# Patient Record
Sex: Female | Born: 1955 | Race: White | Hispanic: No | Smoking: Former smoker
Health system: Southern US, Community
[De-identification: ages and names within clinical notes are randomized; demographics above are authoritative.]

## PROBLEM LIST (undated history)

## (undated) DIAGNOSIS — M4800 Spinal stenosis, site unspecified: Secondary | ICD-10-CM

## (undated) DIAGNOSIS — F329 Major depressive disorder, single episode, unspecified: Secondary | ICD-10-CM

## (undated) DIAGNOSIS — G4730 Sleep apnea, unspecified: Secondary | ICD-10-CM

## (undated) DIAGNOSIS — I10 Essential (primary) hypertension: Secondary | ICD-10-CM

## (undated) DIAGNOSIS — C801 Malignant (primary) neoplasm, unspecified: Secondary | ICD-10-CM

## (undated) DIAGNOSIS — G43909 Migraine, unspecified, not intractable, without status migrainosus: Secondary | ICD-10-CM

## (undated) DIAGNOSIS — J45909 Unspecified asthma, uncomplicated: Secondary | ICD-10-CM

## (undated) DIAGNOSIS — F32A Depression, unspecified: Secondary | ICD-10-CM

## (undated) DIAGNOSIS — K219 Gastro-esophageal reflux disease without esophagitis: Secondary | ICD-10-CM

## (undated) DIAGNOSIS — K760 Fatty (change of) liver, not elsewhere classified: Secondary | ICD-10-CM

## (undated) DIAGNOSIS — I2510 Atherosclerotic heart disease of native coronary artery without angina pectoris: Secondary | ICD-10-CM

## (undated) HISTORY — PX: ECTOPIC PREGNANCY SURGERY: SHX613

## (undated) HISTORY — PX: FRACTURE SURGERY: SHX138

## (undated) HISTORY — PX: TUBAL LIGATION: SHX77

## (undated) HISTORY — PX: DILATION AND CURETTAGE OF UTERUS: SHX78

---

## 2011-09-15 DIAGNOSIS — F32A Depression, unspecified: Secondary | ICD-10-CM | POA: Insufficient documentation

## 2015-06-06 ENCOUNTER — Encounter: Payer: Self-pay | Admitting: Emergency Medicine

## 2015-06-06 ENCOUNTER — Ambulatory Visit
Admission: EM | Admit: 2015-06-06 | Discharge: 2015-06-06 | Disposition: A | Discharge status: Home / Self Care | Payer: BC Managed Care – PPO | Attending: Family Medicine | Admitting: Family Medicine

## 2015-06-06 DIAGNOSIS — G43009 Migraine without aura, not intractable, without status migrainosus: Secondary | ICD-10-CM | POA: Diagnosis not present

## 2015-06-06 HISTORY — DX: Major depressive disorder, single episode, unspecified: F32.9

## 2015-06-06 HISTORY — DX: Depression, unspecified: F32.A

## 2015-06-06 HISTORY — DX: Essential (primary) hypertension: I10

## 2015-06-06 HISTORY — DX: Migraine, unspecified, not intractable, without status migrainosus: G43.909

## 2015-06-06 MED ORDER — KETOROLAC TROMETHAMINE 60 MG/2ML IM SOLN
60.0000 mg | Freq: Once | INTRAMUSCULAR | Status: AC
  Administered 2015-06-06: 60 mg via INTRAMUSCULAR

## 2015-06-06 MED ORDER — MUPIROCIN 2 % EX OINT: 1.0000 "application " | TOPICAL_OINTMENT | Freq: Three times a day (TID) | CUTANEOUS | Status: DC

## 2015-06-06 MED ORDER — KETOROLAC TROMETHAMINE 10 MG PO TABS: 10.0000 mg | ORAL_TABLET | Freq: Three times a day (TID) | ORAL | Status: DC | PRN

## 2015-06-06 MED ORDER — ONDANSETRON 4 MG PO TBDP
4.0000 mg | ORAL_TABLET | Freq: Once | ORAL | Status: AC
  Administered 2015-06-06: 4 mg via ORAL

## 2015-06-06 MED ORDER — FLUTICASONE PROPIONATE 50 MCG/ACT NA SUSP: 2.0000 | Freq: Every day | NASAL | Status: DC

## 2015-06-06 NOTE — ED Notes (Signed)
Patient c/o sinus pressure and congestion and HA since yesterday.

## 2015-06-06 NOTE — ED Provider Notes (Signed)
CSN: [id number]     Arrival date & time 06/06/15  1614 History   First MD Initiated Contact with Patient 06/06/15 1719     Chief Complaint  Patient presents with  . Facial Pain  . Headache   (Consider location/radiation/quality/duration/timing/severity/associated sxs/prior Treatment) HPI   This 59 year old female who presents with a headache she feels mostly behind her right eye and spreading into the back of her head and sinus infection symptoms with pressure and age of yellow-green C mucus last 2-3 days. She is felt feverish but has not taken her temperature and is afebrile today. Her blood pressure is elevated she has hypertension and is on medications and may possibly be from her pain. She is under a lot of pressure at the present time since her family is coming in to visit for the holidays. She gets this on a yearly basis with the anticipation. She usually takes Fioricet for her headache but this does not seem to be working this time.  Past Medical History  Diagnosis Date  . Migraine   . Hypertension   . Depression    Past Surgical History  Procedure Laterality Date  . Cesarean section    . Tubal ligation     History reviewed. No pertinent family history. Social History  Substance Use Topics  . Smoking status: Former Research scientist (life sciences)  . Smokeless tobacco: None  . Alcohol Use: Yes   OB History    No data available     Review of Systems  Constitutional: Positive for fever and activity change. Negative for chills, diaphoresis, appetite change and fatigue.  HENT: Positive for congestion, postnasal drip, rhinorrhea and sinus pressure.   Eyes: Positive for pain.  Gastrointestinal: Positive for nausea.  Neurological: Positive for headaches.    Allergies  Penicillins  Home Medications   Prior to Admission medications   Medication Sig Start Date End Date Taking? Authorizing Provider  benazepril (LOTENSIN) 10 MG tablet Take 10 mg by mouth daily.   Yes Historical Provider, MD   butalbital-acetaminophen-caffeine (FIORICET, ESGIC) 50-325-40 MG tablet Take 1 tablet by mouth 2 (two) times daily as needed for headache.   Yes Historical Provider, MD  sertraline (ZOLOFT) 100 MG tablet Take 100 mg by mouth daily.   Yes Historical Provider, MD  fluticasone (FLONASE) 50 MCG/ACT nasal spray Place 2 sprays into both nostrils daily. 06/06/15   Lorin Picket, PA-C  ketorolac (TORADOL) 10 MG tablet Take 1 tablet (10 mg total) by mouth every 8 (eight) hours as needed. 06/06/15   Lorin Picket, PA-C  mupirocin ointment (BACTROBAN) 2 % Apply 1 application topically 3 (three) times daily. 06/06/15   Lorin Picket, PA-C   Meds Ordered and Administered this Visit   Medications  ketorolac (TORADOL) injection 60 mg (60 mg Intramuscular Given 06/06/15 1810)  ondansetron (ZOFRAN-ODT) disintegrating tablet 4 mg (4 mg Oral Given 06/06/15 1810)    BP 153/95 mmHg  Pulse 68  Temp(Src) 97.9 F (36.6 C) (Tympanic)  Resp 18  Ht 5\' 7"  (1.702 m)  Wt 200 lb (90.719 kg)  BMI 31.32 kg/m2  SpO2 99% No data found.   Physical Exam  Constitutional: She is oriented to person, place, and time. She appears well-developed and well-nourished. No distress.  HENT:  Head: Normocephalic and atraumatic.  Right Ear: External ear normal.  Left Ear: External ear normal.  Nose: Nose normal.  Mouth/Throat: Oropharynx is clear and moist. No oropharyngeal exudate.  Eyes: Conjunctivae are normal. Pupils are equal, round, and reactive  to light.  Neck: Normal range of motion. Neck supple.  Pulmonary/Chest: Effort normal and breath sounds normal. No stridor. No respiratory distress. She has no wheezes. She has no rales.  Musculoskeletal: Normal range of motion. She exhibits no edema or tenderness.  Lymphadenopathy:    She has no cervical adenopathy.  Neurological: She is alert and oriented to person, place, and time.  Skin: Skin is warm and dry. She is not diaphoretic.  Psychiatric: She has a normal  mood and affect. Her behavior is normal. Judgment and thought content normal.  Nursing note and vitals reviewed.   ED Course  Procedures (including critical care time)  Labs Review Labs Reviewed - No data to display  Imaging Review No results found.   Visual Acuity Review  Right Eye Distance:   Left Eye Distance:   Bilateral Distance:    Right Eye Near:   Left Eye Near:    Bilateral Near:     18:10 Medication Given LW  ketorolac (TORADOL) injection 60 mg - Dose: 60 mg ; Route: Intramuscular ; Site: Right Upper Outer Quadrant ; Scheduled Time: [id number]     18:10 Medication Given LW  ondansetron (ZOFRAN-ODT) disintegrating tablet 4 mg - Dose: 4 mg ; Route: Oral ; Scheduled Time: [id number]         MDM   1. Migraine without aura and without status migrainosus, not intractable    New Prescriptions   FLUTICASONE (FLONASE) 50 MCG/ACT NASAL SPRAY    Place 2 sprays into both nostrils daily.   KETOROLAC (TORADOL) 10 MG TABLET    Take 1 tablet (10 mg total) by mouth every 8 (eight) hours as needed.   MUPIROCIN OINTMENT (BACTROBAN) 2 %    Apply 1 application topically 3 (three) times daily.  Plan: 1. Diagnosis reviewed with patient 2. rx as per orders; risks, benefits, potential side effects reviewed with patient 3. Recommend supportive treatment with Flonase and rest and fluids. Follow-up with her primary care. Given her a few Toradol pills to use at home as necessary. I've also provided her with some Bactroban that she will use intranasally for a small sore that she has on the inner side of her nostril on the left 4. F/u prn if symptoms worsen or don't improve     Lorin Picket, PA-C 06/06/15 1859

## 2015-06-06 NOTE — Discharge Instructions (Signed)

## 2015-10-15 ENCOUNTER — Ambulatory Visit
Admission: EM | Admit: 2015-10-15 | Discharge: 2015-10-15 | Disposition: A | Discharge status: Home / Self Care | Payer: BC Managed Care – PPO | Attending: Family Medicine | Admitting: Family Medicine

## 2015-10-15 ENCOUNTER — Encounter: Payer: Self-pay | Admitting: *Deleted

## 2015-10-15 DIAGNOSIS — H6981 Other specified disorders of Eustachian tube, right ear: Secondary | ICD-10-CM

## 2015-10-15 DIAGNOSIS — J019 Acute sinusitis, unspecified: Secondary | ICD-10-CM

## 2015-10-15 DIAGNOSIS — J029 Acute pharyngitis, unspecified: Secondary | ICD-10-CM

## 2015-10-15 LAB — RAPID STREP SCREEN (MED CTR MEBANE ONLY): Streptococcus, Group A Screen (Direct): NEGATIVE

## 2015-10-15 MED ORDER — FLUTICASONE PROPIONATE 50 MCG/ACT NA SUSP: 2.0000 | Freq: Every day | NASAL | Status: DC

## 2015-10-15 MED ORDER — FEXOFENADINE-PSEUDOEPHED ER 180-240 MG PO TB24: 1.0000 | ORAL_TABLET | Freq: Every day | ORAL | Status: DC

## 2015-10-15 MED ORDER — FEXOFENADINE-PSEUDOEPHED ER 180-240 MG PO TB24: 1.0000 | ORAL_TABLET | Freq: Every day | ORAL | Status: AC

## 2015-10-15 MED ORDER — AMOXICILLIN-POT CLAVULANATE 875-125 MG PO TABS: 1.0000 | ORAL_TABLET | Freq: Two times a day (BID) | ORAL | Status: DC

## 2015-10-15 NOTE — ED Notes (Signed)
Patient started having symptoms of sore throat and nasal congestion 1 week ago. Additional symptoms of bilateral ear pain occurred more recently.

## 2015-10-15 NOTE — ED Provider Notes (Signed)
CSN: [id number]     Arrival date & time 10/15/15  [id number] History   First MD Initiated Contact with Patient 10/15/15 1028    Nurses notes were reviewed.  Chief Complaint  Patient presents with  . Sore Throat  . Nasal Congestion   Patient since 60 year old white female who states that she's had sore throat for over a week. She reports while having a sore throat she's had nasal congestion and cough. Last night her right ear started bothering her and was quite painful. She reports no productive cough nasal congestions bad. She is a former smoker.  Medical problems she is being treated for hypertension and depression. She's had multiple skin surgeries for skin cancer she's under current treatment now. She's having suspicious treatment since she's had Mohs surgery for him and want to repeat most surgically to her. He states that she was unable to work yesterday and she also was able today because of fatigue and tiredness.  (Consider location/radiation/quality/duration/timing/severity/associated sxs/prior Treatment) Patient is a 60 y.o. female presenting with pharyngitis. The history is provided by the patient. No language interpreter was used.  Sore Throat This is a new problem. The current episode started more than 1 week ago. The problem has been gradually worsening. Pertinent negatives include no chest pain, no abdominal pain, no headaches and no shortness of breath. Nothing aggravates the symptoms. Nothing relieves the symptoms. She has tried nothing for the symptoms. The treatment provided no relief.    Past Medical History  Diagnosis Date  . Migraine   . Hypertension   . Depression    Past Surgical History  Procedure Laterality Date  . Cesarean section    . Tubal ligation     History reviewed. No pertinent family history. Social History  Substance Use Topics  . Smoking status: Former Research scientist (life sciences)  . Smokeless tobacco: Never Used  . Alcohol Use: Yes   OB History    No data available      Review of Systems  HENT: Positive for ear pain, rhinorrhea and sinus pressure.   Respiratory: Negative for shortness of breath.   Cardiovascular: Negative for chest pain.  Gastrointestinal: Negative for abdominal pain.  Neurological: Negative for headaches.  All other systems reviewed and are negative.   Allergies  Penicillins  Home Medications   Prior to Admission medications   Medication Sig Start Date End Date Taking? Authorizing Provider  benazepril (LOTENSIN) 10 MG tablet Take 10 mg by mouth daily.   Yes Historical Provider, MD  fluticasone (FLONASE) 50 MCG/ACT nasal spray Place 2 sprays into both nostrils daily. 06/06/15  Yes Lorin Picket, PA-C  sertraline (ZOLOFT) 100 MG tablet Take 100 mg by mouth daily.   Yes Historical Provider, MD  amoxicillin-clavulanate (AUGMENTIN) 875-125 MG tablet Take 1 tablet by mouth 2 (two) times daily. 10/15/15   Frederich Cha, MD  butalbital-acetaminophen-caffeine (FIORICET, ESGIC) [phone] MG tablet Take 1 tablet by mouth 2 (two) times daily as needed for headache.    Historical Provider, MD  fexofenadine-pseudoephedrine (ALLEGRA-D ALLERGY & CONGESTION) 180-240 MG 24 hr tablet Take 1 tablet by mouth daily. 10/15/15   Frederich Cha, MD  fluticasone (FLONASE) 50 MCG/ACT nasal spray Place 2 sprays into both nostrils daily. 10/15/15   Frederich Cha, MD  ketorolac (TORADOL) 10 MG tablet Take 1 tablet (10 mg total) by mouth every 8 (eight) hours as needed. 06/06/15   Lorin Picket, PA-C  mupirocin ointment (BACTROBAN) 2 % Apply 1 application topically 3 (three) times daily. 06/06/15  Lorin Picket, PA-C   Meds Ordered and Administered this Visit  Medications - No data to display  BP 125/73 mmHg  Pulse 75  Temp(Src) 98.1 F (36.7 C) (Oral)  Resp 18  Ht 5\' 7"  (1.702 m)  Wt 200 lb (90.719 kg)  BMI 31.32 kg/m2  SpO2 97% No data found.   Physical Exam  Constitutional: She is oriented to person, place, and time. She appears well-developed  and well-nourished.  HENT:  Head: Normocephalic and atraumatic.  Right Ear: External ear and ear canal normal. Tympanic membrane is bulging. A middle ear effusion is present.  Left Ear: Hearing, tympanic membrane, external ear and ear canal normal.  Nose: Rhinorrhea present. Right sinus exhibits maxillary sinus tenderness. Left sinus exhibits maxillary sinus tenderness.  Mouth/Throat: Uvula is midline. No uvula swelling. Posterior oropharyngeal erythema present.  Eyes: Conjunctivae are normal. Pupils are equal, round, and reactive to light.  Neck: Normal range of motion.  Cardiovascular: Normal rate and regular rhythm.   Pulmonary/Chest: Effort normal and breath sounds normal.  Musculoskeletal: Normal range of motion. She exhibits no tenderness.  Neurological: She is alert and oriented to person, place, and time.  Skin: Skin is warm and dry.  Psychiatric: She has a normal mood and affect.  Vitals reviewed.   ED Course  Procedures (including critical care time)  Labs Review Labs Reviewed  RAPID STREP SCREEN (NOT AT [facility])  CULTURE, GROUP A STREP [facility])    Imaging Review No results found.   Visual Acuity Review  Right Eye Distance:   Left Eye Distance:   Bilateral Distance:    Right Eye Near:   Left Eye Near:    Bilateral Near:      Results for orders placed or performed during the hospital encounter of 10/15/15  Rapid strep screen  Result Value Ref Range   Streptococcus, Group A Screen (Direct) NEGATIVE NEGATIVE     MDM   1. Eustachian tube dysfunction, right   2. Acute sinusitis, recurrence not specified, unspecified location   3. Pharyngitis    Will have patient return to PCP if not better in 1-2 weeks. Patient is assured me that she is taken Augmentin since she was told she had penicillin allergies. Augmentin has been used before  as well as amoxicillin. Will give a prescription for Augmentin 875 one tablet twice a day Allegra-D and Flonase nasal spray as well.  Follow-up as needed. Work note given for today and tomorrow.   Note: This dictation was prepared with Dragon dictation along with smaller phrase technology. Any transcriptional errors that result from this process are unintentional.    Frederich Cha, MD 10/15/15 1131

## 2015-10-15 NOTE — Discharge Instructions (Signed)
Pharyngitis Pharyngitis is a sore throat (pharynx). There is redness, pain, and swelling of your throat. HOME CARE   Drink enough fluids to keep your pee (urine) clear or pale yellow.  Only take medicine as told by your doctor.  You may get sick again if you do not take medicine as told. Finish your medicines, even if you start to feel better.  Do not take aspirin.  Rest.  Rinse your mouth (gargle) with salt water ( tsp of salt per 1 qt of water) every 1-2 hours. This will help the pain.  If you are not at risk for choking, you can suck on hard candy or sore throat lozenges. GET HELP IF:  You have large, tender lumps on your neck.  You have a rash.  You cough up green, yellow-brown, or bloody spit. GET HELP RIGHT AWAY IF:   You have a stiff neck.  You drool or cannot swallow liquids.  You throw up (vomit) or are not able to keep medicine or liquids down.  You have very bad pain that does not go away with medicine.  You have problems breathing (not from a stuffy nose). MAKE SURE YOU:   Understand these instructions.  Will watch your condition.  Will get help right away if you are not doing well or get worse.   This information is not intended to replace advice given to you by your health care provider. Make sure you discuss any questions you have with your health care provider.   Document Released: 11/25/2007 Document Revised: 03/29/2013 Document Reviewed: 02/13/2013 Elsevier Interactive Patient Education 2016 Else[employer]  Sinusitis, Adult Sinusitis is redness, soreness, and puffiness (inflammation) of the air pockets in the bones of your face (sinuses). The redness, soreness, and puffiness can cause air and mucus to get trapped in your sinuses. This can allow germs to grow and cause an infection.  HOME CARE   Drink enough fluids to keep your pee (urine) clear or pale yellow.  Use a humidifier in your home.  Run a hot shower to create steam in the  bathroom. Sit in the bathroom with the door closed. Breathe in the steam 3-4 times a day.  Put a warm, moist washcloth on your face 3-4 times a day, or as told by your doctor.  Use salt water sprays (saline sprays) to wet the thick fluid in your nose. This can help the sinuses drain.  Only take medicine as told by your doctor. GET HELP RIGHT AWAY IF:   Your pain gets worse.  You have very bad headaches.  You are sick to your stomach (nauseous).  You throw up (vomit).  You are very sleepy (drowsy) all the time.  Your face is puffy (swollen).  Your vision changes.  You have a stiff neck.  You have trouble breathing. MAKE SURE YOU:   Understand these instructions.  Will watch your condition.  Will get help right away if you are not doing well or get worse.   This information is not intended to replace advice given to you by your health care provider. Make sure you discuss any questions you have with your health care provider.   Document Released: 11/25/2007 Document Revised: 06/29/2014 Document Reviewed: 01/12/2012 Elsevier Interactive Patient Education 2016 [place] media is inflammation of your middle ear. This occurs when the auditory tube (eustachian tube) leading from the back of your nose (nasopharynx) to your eardrum is blocked. This blockage may result from a cold, environmental  allergies, or an upper respiratory infection. Unresolved barotitis media may lead to damage or hearing loss (barotrauma), which may become permanent. HOME CARE INSTRUCTIONS   Use medicines as recommended by your health care provider. Over-the-counter medicines will help unblock the canal and can help during times of air travel.  Do not put anything into your ears to clean or unplug them. Eardrops will not be helpful.  Do not swim, dive, or fly until your health care provider says it is all right to do so. If these activities are necessary, chewing gum with  frequent, forceful swallowing may help. It is also helpful to hold your nose and gently blow to pop your ears for equalizing pressure changes. This forces air into the eustachian tube.  Only take over-the-counter or prescription medicines for pain, discomfort, or fever as directed by your health care provider.  A decongestant may be helpful in decongesting the middle ear and make pressure equalization easier. SEEK MEDICAL CARE IF:  You experience a serious form of dizziness in which you feel as if the room is spinning and you feel nauseated (vertigo).  Your symptoms only involve one ear. SEEK IMMEDIATE MEDICAL CARE IF:   You develop a severe headache, dizziness, or severe ear pain.  You have bloody or pus-like drainage from your ears.  You develop a fever.  Your problems do not improve or become worse. MAKE SURE YOU:   Understand these instructions.  Will watch your condition.  Will get help right away if you are not doing well or get worse.   This information is not intended to replace advice given to you by your health care provider. Make sure you discuss any questions you have with your health care provider.   Document Released: 06/05/2000 Document Revised: 03/29/2013 Document Reviewed: 01/03/2013 Elsevier Interactive Patient Education [employer].

## 2015-10-17 LAB — CULTURE, GROUP A STREP (THRC)

## 2016-04-17 DIAGNOSIS — G4733 Obstructive sleep apnea (adult) (pediatric): Secondary | ICD-10-CM | POA: Insufficient documentation

## 2016-12-22 ENCOUNTER — Ambulatory Visit (INDEPENDENT_AMBULATORY_CARE_PROVIDER_SITE_OTHER): Payer: BC Managed Care – PPO | Admitting: Vascular Surgery

## 2016-12-22 ENCOUNTER — Other Ambulatory Visit (INDEPENDENT_AMBULATORY_CARE_PROVIDER_SITE_OTHER): Payer: Self-pay

## 2016-12-22 ENCOUNTER — Encounter (INDEPENDENT_AMBULATORY_CARE_PROVIDER_SITE_OTHER): Payer: Self-pay

## 2016-12-22 ENCOUNTER — Encounter (INDEPENDENT_AMBULATORY_CARE_PROVIDER_SITE_OTHER): Payer: Self-pay | Admitting: Vascular Surgery

## 2016-12-22 VITALS — BP 123/80 | HR 70 | Resp 17 | Ht 67.0 in | Wt 220.0 lb

## 2016-12-22 DIAGNOSIS — M79604 Pain in right leg: Secondary | ICD-10-CM

## 2016-12-22 DIAGNOSIS — I10 Essential (primary) hypertension: Secondary | ICD-10-CM | POA: Diagnosis not present

## 2016-12-22 DIAGNOSIS — I700 Atherosclerosis of aorta: Secondary | ICD-10-CM

## 2016-12-22 DIAGNOSIS — M79605 Pain in left leg: Secondary | ICD-10-CM | POA: Diagnosis not present

## 2016-12-22 DIAGNOSIS — M79609 Pain in unspecified limb: Secondary | ICD-10-CM | POA: Insufficient documentation

## 2016-12-22 NOTE — Patient Instructions (Signed)
Peripheral Vascular Disease Peripheral vascular disease (PVD) is a disease of the blood vessels that are not part of your heart and brain. A simple term for PVD is poor circulation. In most cases, PVD narrows the blood vessels that carry blood from your heart to the rest of your body. This can result in a decreased supply of blood to your arms, legs, and internal organs, like your stomach or kidneys. However, it most often affects a person's lower legs and feet. There are two types of PVD.  Organic PVD. This is the more common type. It is caused by damage to the structure of blood vessels.  Functional PVD. This is caused by conditions that make blood vessels contract and tighten (spasm).  Without treatment, PVD tends to get worse over time. PVD can also lead to acute ischemic limb. This is when an arm or limb suddenly has trouble getting enough blood. This is a medical emergency. What are the causes? Each type of PVD has many different causes. The most common cause of PVD is buildup of a fatty material (plaque) inside of your arteries (atherosclerosis). Small amounts of plaque can break off from the walls of the blood vessels and become lodged in a smaller artery. This blocks blood flow and can cause acute ischemic limb. Other common causes of PVD include:  Blood clots that form inside of blood vessels.  Injuries to blood vessels.  Diseases that cause inflammation of blood vessels or cause blood vessel spasms.  Health behaviors and health history that increase your risk of developing PVD.  What increases the risk? You may have a greater risk of PVD if you:  Have a family history of PVD.  Have certain medical conditions, including: ? High cholesterol. ? Diabetes. ? High blood pressure (hypertension). ? Coronary heart disease. ? Past problems with blood clots. ? Past injury, such as burns or a broken bone. These may have damaged blood vessels in your limbs. ? Buerger disease. This is  caused by inflamed blood vessels in your hands and feet. ? Some forms of arthritis. ? Rare birth defects that affect the arteries in your legs.  Use tobacco.  Do not get enough exercise.  Are obese.  Are age 50 or older.  What are the signs or symptoms? PVD may cause many different symptoms. Your symptoms depend on what part of your body is not getting enough blood. Some common signs and symptoms include:  Cramps in your lower legs. This may be a symptom of poor leg circulation (claudication).  Pain and weakness in your legs while you are physically active that goes away when you rest (intermittent claudication).  Leg pain when at rest.  Leg numbness, tingling, or weakness.  Coldness in a leg or foot, especially when compared with the other leg.  Skin or hair changes. These can include: ? Hair loss. ? Shiny skin. ? Pale or bluish skin. ? Thick toenails.  Inability to get or maintain an erection (erectile dysfunction).  People with PVD are more prone to developing ulcers and sores on their toes, feet, or legs. These may take longer than normal to heal. How is this diagnosed? Your health care provider may diagnose PVD from your signs and symptoms. The health care provider will also do a physical exam. You may have tests to find out what is causing your PVD and determine its severity. Tests may include:  Blood pressure recordings from your arms and legs and measurements of the strength of your pulses (  pulse volume recordings).  Imaging studies using sound waves to take pictures of the blood flow through your blood vessels (Doppler ultrasound).  Injecting a dye into your blood vessels before having imaging studies using: ? X-rays (angiogram or arteriogram). ? Computer-generated X-rays (CT angiogram). ? A powerful electromagnetic field and a computer (magnetic resonance angiogram or MRA).  How is this treated? Treatment for PVD depends on the cause of your condition and the  severity of your symptoms. It also depends on your age. Underlying causes need to be treated and controlled. These include long-lasting (chronic) conditions, such as diabetes, high cholesterol, and high blood pressure. You may need to first try making lifestyle changes and taking medicines. Surgery may be needed if these do not work. Lifestyle changes may include:  Quitting smoking.  Exercising regularly.  Following a low-fat, low-cholesterol diet.  Medicines may include:  Blood thinners to prevent blood clots.  Medicines to improve blood flow.  Medicines to improve your blood cholesterol levels.  Surgical procedures may include:  A procedure that uses an inflated balloon to open a blocked artery and improve blood flow (angioplasty).  A procedure to put in a tube (stent) to keep a blocked artery open (stent implant).  Surgery to reroute blood flow around a blocked artery (peripheral bypass surgery).  Surgery to remove dead tissue from an infected wound on the affected limb.  Amputation. This is surgical removal of the affected limb. This may be necessary in cases of acute ischemic limb that are not improved through medical or surgical treatments.  Follow these instructions at home:  Take medicines only as directed by your health care provider.  Do not use any tobacco products, including cigarettes, chewing tobacco, or electronic cigarettes. If you need help quitting, ask your health care provider.  Lose weight if you are overweight, and maintain a healthy weight as directed by your health care provider.  Eat a diet that is low in fat and cholesterol. If you need help, ask your health care provider.  Exercise regularly. Ask your health care provider to suggest some good activities for you.  Use compression stockings or other mechanical devices as directed by your health care provider.  Take good care of your feet. ? Wear comfortable shoes that fit well. ? Check your feet  often for any cuts or sores. Contact a health care provider if:  You have cramps in your legs while walking.  You have leg pain when you are at rest.  You have coldness in a leg or foot.  Your skin changes.  You have erectile dysfunction.  You have cuts or sores on your feet that are not healing. Get help right away if:  Your arm or leg turns cold and blue.  Your arms or legs become red, warm, swollen, painful, or numb.  You have chest pain or trouble breathing.  You suddenly have weakness in your face, arm, or leg.  You become very confused or lose the ability to speak.  You suddenly have a very bad headache or lose your vision. This information is not intended to replace advice given to you by your health care provider. Make sure you discuss any questions you have with your health care provider. Document Released: 07/16/2004 Document Revised: 11/14/2015 Document Reviewed: 11/16/2013 Elsevier Interactive Patient Education  2017 Elsevier Inc.  

## 2016-12-22 NOTE — Assessment & Plan Note (Signed)
blood pressure control important in reducing the progression of atherosclerotic disease and aneurysmal degeneration. On appropriate oral medications.  

## 2016-12-22 NOTE — Progress Notes (Signed)
Patient ID: Samantha Avila, female   DOB: 1956/05/22, 61 y.o.   MRN: [id number]  Chief Complaint  Patient presents with  . New Evaluation    Tortuous aorta    HPI Samantha Avila is a 61 y.o. female.  I am asked to see the patient by Dr. Meda Coffee for evaluation of tortuous thoracic aorta with atherosclerosis seen on a CXR.  The patient reports 4 episodes of respiratory infection including pneumonia over the past year or 2. On a recent x-ray, again it was noted that her thoracic aorta was tortuous with what was described as mild atherosclerotic changes. I do not have the images for review just the report. The patient does not have a previous history of aneurysmal disease. She does not really have any current chest pain. Her shortness of breath is improved with treatment of her respiratory infection. The patient does describe pain in her legs with short distances of walking. She has a previous history of spinal stenosis, and has been told previously might be related to that. With her new vascular findings she is concerned about a vascular source of her lower extremity symptoms. She does not have ulceration or infection. She denies ischemic rest pain.   Past Medical History:  Diagnosis Date  . Depression   . Hypertension   . Migraine     Past Surgical History:  Procedure Laterality Date  . CESAREAN SECTION    . TUBAL LIGATION      Family History No bleeding disorders, clotting disorders, autoimmune diseases, or aneurysms  Social History Social History  Substance Use Topics  . Smoking status: Former Research scientist (life sciences)  . Smokeless tobacco: Never Used  . Alcohol use Yes  NO IVDU  Allergies  Allergen Reactions  . Penicillins Anaphylaxis    Current Outpatient Prescriptions  Medication Sig Dispense Refill  . albuterol (PROVENTIL HFA;VENTOLIN HFA) 108 (90 Base) MCG/ACT inhaler Inhale into the lungs.    Marland Kitchen aluminum chloride (DRYSOL) 20 % external solution Apply topically.    Marland Kitchen aspirin EC 81 MG  tablet Take by mouth.    . benazepril (LOTENSIN) 10 MG tablet Take 10 mg by mouth daily.    . butalbital-acetaminophen-caffeine (FIORICET, ESGIC) 50-325-40 MG tablet Take 1 tablet by mouth 2 (two) times daily as needed for headache.    . cyclobenzaprine (FLEXERIL) 10 MG tablet     . fexofenadine-pseudoephedrine (ALLEGRA-D ALLERGY & CONGESTION) 180-240 MG 24 hr tablet Take 1 tablet by mouth daily. 30 tablet 0  . HYDROcodone-acetaminophen (NORCO/VICODIN) 5-325 MG tablet Take by mouth.    . sertraline (ZOLOFT) 100 MG tablet Take 100 mg by mouth daily.     No current facility-administered medications for this visit.       REVIEW OF SYSTEMS (Negative unless checked)  Constitutional: [] Weight loss  [] Fever  [] Chills Cardiac: [] Chest pain   [] Chest pressure   [] Palpitations   [] Shortness of breath when laying flat   [] Shortness of breath at rest   [x] Shortness of breath with exertion. Vascular:  [] Pain in legs with walking   [] Pain in legs at rest   [] Pain in legs when laying flat   [x] Claudication   [] Pain in feet when walking  [] Pain in feet at rest  [] Pain in feet when laying flat   [] History of DVT   [] Phlebitis   [] Swelling in legs   [] Varicose veins   [] Non-healing ulcers Pulmonary:   [] Uses home oxygen   [x] Productive cough   [] Hemoptysis   [] Wheeze  [] COPD   []   Asthma Neurologic:  [] Dizziness  [] Blackouts   [] Seizures   [] History of stroke   [] History of TIA  [] Aphasia   [] Temporary blindness   [] Dysphagia   [] Weakness or numbness in arms   [] Weakness or numbness in legs  X Migraines Musculoskeletal:  [x] Arthritis   [] Joint swelling   [] Joint pain   [x] Low back pain Hematologic:  [] Easy bruising  [] Easy bleeding   [] Hypercoagulable state   [] Anemic  [] Hepatitis Gastrointestinal:  [] Blood in stool   [] Vomiting blood  [] Gastroesophageal reflux/heartburn   [] Abdominal pain Genitourinary:  [] Chronic kidney disease   [] Difficult urination  [] Frequent urination  [] Burning with urination    [] Hematuria Skin:  [] Rashes   [] Ulcers   [] Wounds Psychological:  [] History of anxiety   [x]  History of major depression.    Physical Exam BP 123/80 (BP Location: Right Arm)   Pulse 70   Resp 17   Ht 5\' 7"  (1.702 m)   Wt 99.8 kg (220 lb)   BMI 34.46 kg/m  Gen:  WD/WN, NAD Head: [place]/AT, No temporalis wasting.  Ear/Nose/Throat: Hearing grossly intact, nares w/o erythema or drainage, oropharynx w/o Erythema/Exudate Eyes: Conjunctiva clear, sclera non-icteric  Neck: trachea midline.  No bruit or JVD.  Pulmonary:  Good air movement, clear to auscultation bilaterally.  Cardiac: RRR, normal S1, S2, no Murmurs, rubs or gallops. Vascular:  Vessel Right Left  Radial Palpable Palpable                          PT Palpable 1+ Palpable  DP Palpable Palpable   Gastrointestinal: soft, non-tender/non-distended.  Musculoskeletal: M/S 5/5 throughout.  Extremities without ischemic changes.  No deformity or atrophy.  Neurologic: Sensation grossly intact in extremities.  Symmetrical.  Speech is fluent. Motor exam as listed above. Psychiatric: Judgment intact, Mood & affect appropriate for pt's clinical situation. Dermatologic: No rashes or ulcers noted.  No cellulitis or open wounds.    Radiology No results found.  Labs No results found for this or any previous visit (from the past 2160 hour(s)).  Assessment/Plan:  Essential hypertension, benign blood pressure control important in reducing the progression of atherosclerotic disease and aneurysmal degeneration. On appropriate oral medications.   Thoracic aortic atherosclerosis ([place]) The patient had a combination of thoracic aortic atherosclerosis as well as significant tortuosity noted on a plain x-ray. We discussed that x-ray is not a very effective way of evaluating the thoracic aorta, and ultrasound also is ineffective and evaluating the thoracic aorta. I would recommend a CT scan of the chest for further evaluation of these  findings. We discussed that these are often benign as long as atherosclerotic changes are not extensive and degeneration to aneurysm was not seen. This would be best evaluated by CT scan. We will have this done in the near future at her convenience and see her back to discuss the results.  Pain in limb The patient has lower extremity symptoms that are worrisome for atherosclerosis with claudication. Her physical exam does show pulses in the feet which is encouraging, but particularly with the findings on the chest x-ray I think is reasonable to do ABIs for further evaluation. I discussed the pathophysiology and natural history of peripheral arterial disease. We will see the patient back following the studies.      Leotis Pain 12/22/2016, 1:03 PM   This note was created with Dragon medical transcription system.  Any errors from dictation are unintentional.

## 2016-12-22 NOTE — Assessment & Plan Note (Signed)
The patient had a combination of thoracic aortic atherosclerosis as well as significant tortuosity noted on a plain x-ray. We discussed that x-ray is not a very effective way of evaluating the thoracic aorta, and ultrasound also is ineffective and evaluating the thoracic aorta. I would recommend a CT scan of the chest for further evaluation of these findings. We discussed that these are often benign as long as atherosclerotic changes are not extensive and degeneration to aneurysm was not seen. This would be best evaluated by CT scan. We will have this done in the near future at her convenience and see her back to discuss the results.

## 2016-12-22 NOTE — Assessment & Plan Note (Signed)
The patient has lower extremity symptoms that are worrisome for atherosclerosis with claudication. Her physical exam does show pulses in the feet which is encouraging, but particularly with the findings on the chest x-ray I think is reasonable to do ABIs for further evaluation. I discussed the pathophysiology and natural history of peripheral arterial disease. We will see the patient back following the studies.

## 2016-12-28 ENCOUNTER — Ambulatory Visit: Admission: RE | Admit: 2016-12-28 | Payer: BC Managed Care – PPO | Source: Ambulatory Visit

## 2016-12-30 ENCOUNTER — Ambulatory Visit: Payer: BC Managed Care – PPO

## 2017-01-22 ENCOUNTER — Ambulatory Visit
Admission: RE | Admit: 2017-01-22 | Discharge: 2017-01-22 | Disposition: A | Discharge status: Home / Self Care | Payer: BC Managed Care – PPO | Source: Ambulatory Visit | Attending: Vascular Surgery | Admitting: Vascular Surgery

## 2017-01-22 DIAGNOSIS — J9811 Atelectasis: Secondary | ICD-10-CM | POA: Insufficient documentation

## 2017-01-22 DIAGNOSIS — N6489 Other specified disorders of breast: Secondary | ICD-10-CM | POA: Insufficient documentation

## 2017-01-22 DIAGNOSIS — K760 Fatty (change of) liver, not elsewhere classified: Secondary | ICD-10-CM | POA: Insufficient documentation

## 2017-01-22 DIAGNOSIS — I700 Atherosclerosis of aorta: Secondary | ICD-10-CM | POA: Insufficient documentation

## 2017-01-22 MED ORDER — IOPAMIDOL (ISOVUE-370) INJECTION 76%
100.0000 mL | Freq: Once | INTRAVENOUS | Status: AC | PRN
  Administered 2017-01-22: 100 mL via INTRAVENOUS

## 2017-02-02 ENCOUNTER — Ambulatory Visit (INDEPENDENT_AMBULATORY_CARE_PROVIDER_SITE_OTHER): Payer: BC Managed Care – PPO | Admitting: Vascular Surgery

## 2017-02-02 ENCOUNTER — Encounter (INDEPENDENT_AMBULATORY_CARE_PROVIDER_SITE_OTHER): Payer: Self-pay | Admitting: Vascular Surgery

## 2017-02-02 ENCOUNTER — Ambulatory Visit (INDEPENDENT_AMBULATORY_CARE_PROVIDER_SITE_OTHER): Payer: BC Managed Care – PPO

## 2017-02-02 VITALS — BP 140/91 | HR 69 | Resp 17 | Wt 217.0 lb

## 2017-02-02 DIAGNOSIS — M79605 Pain in left leg: Secondary | ICD-10-CM | POA: Diagnosis not present

## 2017-02-02 DIAGNOSIS — I700 Atherosclerosis of aorta: Secondary | ICD-10-CM

## 2017-02-02 DIAGNOSIS — M79604 Pain in right leg: Secondary | ICD-10-CM

## 2017-02-02 DIAGNOSIS — I10 Essential (primary) hypertension: Secondary | ICD-10-CM | POA: Diagnosis not present

## 2017-02-02 NOTE — Assessment & Plan Note (Signed)
Normal arterial perfusion was found on noninvasive studies today. Vascular disease does not appear to be a cause of her lower extremity symptoms.

## 2017-02-02 NOTE — Progress Notes (Signed)
MRN : [id number]  Samantha Avila is a 61 y.o. (1955-08-22) female who presents with chief complaint of  Chief Complaint  Patient presents with  . Follow-up  .  History of Present Illness: Patient returns today in follow up of her CT scan and ABIs. She's had no major changes or problems since her last visit. From a vascular perspective, her CT scan was essentially unremarkable. There are a few specks of atherosclerosis in her thoracic aorta. The official interpretation said moderate atherosclerosis or her thoracic aorta which I think is an incredibly poor interpretation. I have actually showed her the scan today in the areas of atherosclerosis are minimal. Her ABIs today were normal at 1.2 bilaterally with brisk triphasic waveforms. No arterial insufficiency was identified.       Past Medical History:  Diagnosis Date  . Depression   . Hypertension   . Migraine          Past Surgical History:  Procedure Laterality Date  . CESAREAN SECTION    . TUBAL LIGATION      Family History No bleeding disorders, clotting disorders, autoimmune diseases, or aneurysms  Social History     Social History  Substance Use Topics  . Smoking status: Former Research scientist (life sciences)  . Smokeless tobacco: Never Used  . Alcohol use Yes  NO IVDU      Allergies  Allergen Reactions  . Penicillins Anaphylaxis          Current Outpatient Prescriptions  Medication Sig Dispense Refill  . albuterol (PROVENTIL HFA;VENTOLIN HFA) 108 (90 Base) MCG/ACT inhaler Inhale into the lungs.    Marland Kitchen aluminum chloride (DRYSOL) 20 % external solution Apply topically.    Marland Kitchen aspirin EC 81 MG tablet Take by mouth.    . benazepril (LOTENSIN) 10 MG tablet Take 10 mg by mouth daily.    . butalbital-acetaminophen-caffeine (FIORICET, ESGIC) 50-325-40 MG tablet Take 1 tablet by mouth 2 (two) times daily as needed for headache.    . cyclobenzaprine (FLEXERIL) 10 MG tablet     . fexofenadine-pseudoephedrine  (ALLEGRA-D ALLERGY & CONGESTION) 180-240 MG 24 hr tablet Take 1 tablet by mouth daily. 30 tablet 0  . HYDROcodone-acetaminophen (NORCO/VICODIN) 5-325 MG tablet Take by mouth.    . sertraline (ZOLOFT) 100 MG tablet Take 100 mg by mouth daily.     No current facility-administered medications for this visit.       REVIEW OF SYSTEMS (Negative unless checked)  Constitutional: [] Weight loss  [] Fever  [] Chills Cardiac: [] Chest pain   [] Chest pressure   [] Palpitations   [] Shortness of breath when laying flat   [] Shortness of breath at rest   [x] Shortness of breath with exertion. Vascular:  [] Pain in legs with walking   [] Pain in legs at rest   [] Pain in legs when laying flat   [x] Claudication   [] Pain in feet when walking  [] Pain in feet at rest  [] Pain in feet when laying flat   [] History of DVT   [] Phlebitis   [] Swelling in legs   [] Varicose veins   [] Non-healing ulcers Pulmonary:   [] Uses home oxygen   [x] Productive cough   [] Hemoptysis   [] Wheeze  [] COPD   [] Asthma Neurologic:  [] Dizziness  [] Blackouts   [] Seizures   [] History of stroke   [] History of TIA  [] Aphasia   [] Temporary blindness   [] Dysphagia   [] Weakness or numbness in arms   [] Weakness or numbness in legs  X Migraines Musculoskeletal:  [x] Arthritis   [] Joint swelling   [] Joint  pain   [x] Low back pain Hematologic:  [] Easy bruising  [] Easy bleeding   [] Hypercoagulable state   [] Anemic  [] Hepatitis Gastrointestinal:  [] Blood in stool   [] Vomiting blood  [] Gastroesophageal reflux/heartburn   [] Abdominal pain Genitourinary:  [] Chronic kidney disease   [] Difficult urination  [] Frequent urination  [] Burning with urination   [] Hematuria Skin:  [] Rashes   [] Ulcers   [] Wounds Psychological:  [] History of anxiety   [x]  History of major depression.    Physical Examination  BP (!) 140/91   Pulse 69   Resp 17   Wt 217 lb (98.4 kg)   BMI 33.99 kg/m  Gen:  WD/WN, NAD Head: [place]/AT, No temporalis wasting. Ear/Nose/Throat: Hearing  grossly intact, nares w/o erythema or drainage, trachea midline Eyes: Conjunctiva clear. Sclera non-icteric Neck: Supple.  No JVD.  Pulmonary:  Good air movement, no use of accessory muscles.  Cardiac: RRR, normal S1, S2 Vascular:  Vessel Right Left  Radial Palpable Palpable                          PT Palpable 1+ Palpable  DP Palpable Palpable    Musculoskeletal: M/S 5/5 throughout.  No deformity or atrophy. No significant lower extremity edema. Neurologic: Sensation grossly intact in extremities.  Symmetrical.  Speech is fluent.  Psychiatric: Judgment intact, Mood & affect appropriate for pt's clinical situation. Dermatologic: No rashes or ulcers noted.  No cellulitis or open wounds.       Labs No results found for this or any previous visit (from the past 2160 hour(s)).  Radiology Ct Angio Chest Aorta W/cm &/or Wo/cm  Result Date: 01/22/2017 CLINICAL DATA:  Claudication symptoms with abnormal outside chest radiograph EXAM: CT ANGIOGRAPHY CHEST WITH CONTRAST TECHNIQUE: Initially, axial CT images were obtained through the chest without intravenous contrast material administration. Multidetector CT imaging of the chest was performed using the standard protocol during bolus administration of intravenous contrast. Multiplanar CT image reconstructions and MIPs were obtained to evaluate the vascular anatomy. CONTRAST:  100 mL Isovue 370 nonionic COMPARISON:  None. FINDINGS: Cardiovascular: No intramural hematoma is appreciable in the thoracic aorta on the noncontrast enhanced images. There is no appreciable thoracic aortic aneurysm or dissection. The visualized great vessels appear normal. There is modest calcification in the aortic arch and proximal descending thoracic aortic regions. Pericardium is not appreciably thickened. There is no demonstrable pulmonary embolus. Mediastinum/Nodes: Visualized thyroid appears unremarkable. There is no evident thoracic adenopathy. No esophageal  lesion evident on this study. Lungs/Pleura: There is slight atelectatic change in the inferior lingula and lung bases. There is no lung edema or consolidation. No pleural effusion or pleural thickening. Upper Abdomen: There is evidence of a degree of hepatic steatosis. Visualized upper abdominal structures are normal in appearance. Musculoskeletal: There are foci of degenerative change in the thoracic spine. No blastic or lytic bone lesions. There is an area of asymmetric density in the inferior right breast compared with the left, a finding of questionable significance. Review of the MIP images confirms the above findings. IMPRESSION: 1. Modest thoracic aortic atherosclerotic calcification. No thoracic aortic aneurysm or dissection. No appreciable mucosal irregularity or ulceration seen on this study. 2.  No demonstrable pulmonary embolus. 3. No lung edema or consolidation. Mild bibasilar and inferior lingular atelectasis. 4.  No evident adenopathy. 5.  Hepatic steatosis. 6. Asymmetric density in the inferior right breast compared to the left. This finding is of uncertain significance on CT. If patient has not had recent  mammographic evaluation, mammography may be advisable given this asymmetric density. Aortic Atherosclerosis (ICD10-I70.0). Electronically Signed   By: Lowella Grip III M.D.   On: 01/22/2017 08:43    Assessment/Plan Essential hypertension, benign blood pressure control important in reducing the progression of atherosclerotic disease and aneurysmal degeneration. On appropriate oral medications.  Thoracic aortic atherosclerosis ([facility]) her CT scan was essentially unremarkable. There are a few specks of atherosclerosis in her thoracic aorta. The official interpretation said moderate atherosclerosis or her thoracic aorta which I think is an incredibly poor interpretation. I have actually showed her the scan today in the areas of atherosclerosis are minimal. Her ABIs today were normal at 1.2  bilaterally with brisk triphasic waveforms. No arterial insufficiency was identified. She continues to take aspirin regularly as well as a statin agent which seems reasonable. No intervention or ongoing management for this minimal thoracic aortic atherosclerosis is necessary. I will see her back as needed.  Pain in limb Normal arterial perfusion was found on noninvasive studies today. Vascular disease does not appear to be a cause of her lower extremity symptoms.    Leotis Pain, MD  02/02/2017 5:13 PM    This note was created with Dragon medical transcription system.  Any errors from dictation are purely unintentional

## 2017-02-02 NOTE — Assessment & Plan Note (Signed)
her CT scan was essentially unremarkable. There are a few specks of atherosclerosis in her thoracic aorta. The official interpretation said moderate atherosclerosis or her thoracic aorta which I think is an incredibly poor interpretation. I have actually showed her the scan today in the areas of atherosclerosis are minimal. Her ABIs today were normal at 1.2 bilaterally with brisk triphasic waveforms. No arterial insufficiency was identified. She continues to take aspirin regularly as well as a statin agent which seems reasonable. No intervention or ongoing management for this minimal thoracic aortic atherosclerosis is necessary. I will see her back as needed.

## 2017-03-15 ENCOUNTER — Ambulatory Visit
Admission: RE | Admit: 2017-03-15 | Discharge: 2017-03-15 | Disposition: A | Discharge status: Home / Self Care | Payer: BC Managed Care – PPO | Source: Ambulatory Visit | Attending: Internal Medicine | Admitting: Internal Medicine

## 2017-03-15 ENCOUNTER — Ambulatory Visit: Payer: BC Managed Care – PPO | Admitting: Anesthesiology

## 2017-03-15 ENCOUNTER — Encounter: Payer: Self-pay | Admitting: *Deleted

## 2017-03-15 ENCOUNTER — Encounter
Admission: RE | Disposition: A | Discharge status: Home / Self Care | Payer: Self-pay | Source: Ambulatory Visit | Attending: Internal Medicine

## 2017-03-15 DIAGNOSIS — Z79899 Other long term (current) drug therapy: Secondary | ICD-10-CM | POA: Insufficient documentation

## 2017-03-15 DIAGNOSIS — Z880 Allergy status to penicillin: Secondary | ICD-10-CM | POA: Insufficient documentation

## 2017-03-15 DIAGNOSIS — Z7982 Long term (current) use of aspirin: Secondary | ICD-10-CM | POA: Insufficient documentation

## 2017-03-15 DIAGNOSIS — K2970 Gastritis, unspecified, without bleeding: Secondary | ICD-10-CM | POA: Insufficient documentation

## 2017-03-15 DIAGNOSIS — K921 Melena: Secondary | ICD-10-CM | POA: Insufficient documentation

## 2017-03-15 DIAGNOSIS — F329 Major depressive disorder, single episode, unspecified: Secondary | ICD-10-CM | POA: Diagnosis not present

## 2017-03-15 DIAGNOSIS — K760 Fatty (change of) liver, not elsewhere classified: Secondary | ICD-10-CM | POA: Insufficient documentation

## 2017-03-15 DIAGNOSIS — J45909 Unspecified asthma, uncomplicated: Secondary | ICD-10-CM | POA: Diagnosis not present

## 2017-03-15 DIAGNOSIS — K640 First degree hemorrhoids: Secondary | ICD-10-CM | POA: Diagnosis not present

## 2017-03-15 DIAGNOSIS — K5730 Diverticulosis of large intestine without perforation or abscess without bleeding: Secondary | ICD-10-CM | POA: Insufficient documentation

## 2017-03-15 DIAGNOSIS — R1012 Left upper quadrant pain: Secondary | ICD-10-CM | POA: Diagnosis present

## 2017-03-15 DIAGNOSIS — D123 Benign neoplasm of transverse colon: Secondary | ICD-10-CM | POA: Insufficient documentation

## 2017-03-15 DIAGNOSIS — I10 Essential (primary) hypertension: Secondary | ICD-10-CM | POA: Diagnosis not present

## 2017-03-15 DIAGNOSIS — Z8371 Family history of colonic polyps: Secondary | ICD-10-CM | POA: Diagnosis not present

## 2017-03-15 HISTORY — DX: Unspecified asthma, uncomplicated: J45.909

## 2017-03-15 HISTORY — DX: Fatty (change of) liver, not elsewhere classified: K76.0

## 2017-03-15 HISTORY — DX: Spinal stenosis, site unspecified: M48.00

## 2017-03-15 HISTORY — DX: Sleep apnea, unspecified: G47.30

## 2017-03-15 HISTORY — DX: Atherosclerotic heart disease of native coronary artery without angina pectoris: I25.10

## 2017-03-15 HISTORY — PX: COLONOSCOPY WITH PROPOFOL: SHX5780

## 2017-03-15 HISTORY — PX: ESOPHAGOGASTRODUODENOSCOPY (EGD) WITH PROPOFOL: SHX5813

## 2017-03-15 HISTORY — DX: Gastro-esophageal reflux disease without esophagitis: K21.9

## 2017-03-15 SURGERY — ESOPHAGOGASTRODUODENOSCOPY (EGD) WITH PROPOFOL
Anesthesia: General

## 2017-03-15 MED ORDER — PHENYLEPHRINE HCL 10 MG/ML IJ SOLN
INTRAMUSCULAR | Status: DC | PRN
  Administered 2017-03-15: 50 ug via INTRAVENOUS

## 2017-03-15 MED ORDER — PROPOFOL 500 MG/50ML IV EMUL
INTRAVENOUS | Status: AC
  Filled 2017-03-15: qty 50

## 2017-03-15 MED ORDER — FENTANYL CITRATE (PF) 100 MCG/2ML IJ SOLN
INTRAMUSCULAR | Status: AC
  Filled 2017-03-15: qty 2

## 2017-03-15 MED ORDER — GLYCOPYRROLATE 0.2 MG/ML IJ SOLN
INTRAMUSCULAR | Status: DC | PRN
  Administered 2017-03-15: 0.2 mg via INTRAVENOUS

## 2017-03-15 MED ORDER — PROPOFOL 10 MG/ML IV BOLUS
INTRAVENOUS | Status: DC | PRN
  Administered 2017-03-15: 80 mg via INTRAVENOUS

## 2017-03-15 MED ORDER — SODIUM CHLORIDE 0.9 % IV SOLN
INTRAVENOUS | Status: DC | PRN
  Administered 2017-03-15: 14:00:00 via INTRAVENOUS

## 2017-03-15 MED ORDER — FENTANYL CITRATE (PF) 100 MCG/2ML IJ SOLN
INTRAMUSCULAR | Status: DC | PRN
  Administered 2017-03-15: 50 ug via INTRAVENOUS

## 2017-03-15 MED ORDER — PROPOFOL 500 MG/50ML IV EMUL
INTRAVENOUS | Status: DC | PRN
  Administered 2017-03-15: 120 ug/kg/min via INTRAVENOUS

## 2017-03-15 MED ORDER — GLYCOPYRROLATE 0.2 MG/ML IJ SOLN
INTRAMUSCULAR | Status: AC
  Filled 2017-03-15: qty 1

## 2017-03-15 NOTE — H&P (Signed)
Outpatient short stay form Pre-procedure 03/15/2017 1:56 PM Samantha Avila Samantha Avila, M.D.  Primary Physician: Kerin Perna, M.D.  Reason for visit:  LUQ tenderness, melena, family hx of colon polyps  History of present illness:  A 61 year old female presents for intermittent left upper quadrant abdominal pain with melana and is now resolved after stopping over-the-counter aspirin. 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 Last colonoscopy approximately 10 years ago was "normal" per patient. She has a family history of colon polyps in her father, unknown the type of histology and unknown at what age WERE detected.   No current facility-administered medications for this encounter.   Prescriptions Prior to Admission  Medication Sig Dispense Refill Last Dose  . albuterol (PROVENTIL HFA;VENTOLIN HFA) 108 (90 Base) MCG/ACT inhaler Inhale into the lungs.   03/14/2017 at Unknown time  . aluminum chloride (DRYSOL) 20 % external solution Apply topically.   03/14/2017 at Unknown time  . aspirin EC 81 MG tablet Take by mouth.   Past Week at Unknown time  . benazepril (LOTENSIN) 10 MG tablet Take 10 mg by mouth daily.   03/14/2017 at Unknown time  . butalbital-acetaminophen-caffeine (FIORICET, ESGIC) 50-325-40 MG tablet Take 1 tablet by mouth 2 (two) times daily as needed for headache.   Past Week at Unknown time  . cyclobenzaprine (FLEXERIL) 10 MG tablet    Past Week at Unknown time  . fexofenadine-pseudoephedrine (ALLEGRA-D ALLERGY & CONGESTION) 180-240 MG 24 hr tablet Take 1 tablet by mouth daily. 30 tablet 0 Past Week at Unknown time  . fluticasone (FLONASE) 50 MCG/ACT nasal spray    03/14/2017 at Unknown time  . HYDROcodone-acetaminophen (NORCO/VICODIN) 5-325 MG tablet Take by mouth.   Past Week at Unknown time  . ipratropium (ATROVENT HFA) 17 MCG/ACT inhaler Inhale 2 puffs into the lungs every 4 (four) hours as needed for wheezing.   Past Week at Unknown time  .  pravastatin (PRAVACHOL) 10 MG tablet Take 10 mg by mouth daily.   Past Week at Unknown time  . sertraline (ZOLOFT) 100 MG tablet Take 100 mg by mouth daily.   03/14/2017 at Unknown time     Allergies  Allergen Reactions  . Penicillins Anaphylaxis     Past Medical History:  Diagnosis Date  . Asthma   . Depression   . Fatty liver   . Hypertension   . Migraine     Review of systems:      Physical Exam  Gen: NAD. Appears comfortable.  HEENT: Samantha Avila/AT. PERRLA. Normal external ear exam.  Chest: CTA, no wheezes.  CV: RR nl S1, S2. No gallops.  Abd: soft, nt, nd. BS+  Ext: no edema. Pulses 2+  Neuro: Alert and oriented. Judgement appears normal. Nonfocal.   Planned procedures:   Proceed with EGD and colonoscopy. The patient understands the nature of the planned procedure, indications, risks, alternatives and potential complications including but not limited to bleeding, infection, perforation, damage to internal organs and possible oversedation/side effects from anesthesia. The patient agrees and gives consent to proceed.   Jams Trickett Samantha Avila, M.D. Gastroenterology 03/15/2017  1:56 PM

## 2017-03-15 NOTE — Anesthesia Preprocedure Evaluation (Signed)
Anesthesia Evaluation  Patient identified by MRN, date of birth, ID band Patient awake    Reviewed: Allergy & Precautions, H&P , NPO status , Patient's Chart, lab work & pertinent test results, reviewed documented beta blocker date and time   Airway Mallampati: II   Neck ROM: full    Dental  (+) Poor Dentition   Pulmonary neg pulmonary ROS, asthma , former smoker,    Pulmonary exam normal        Cardiovascular Exercise Tolerance: Good hypertension, On Medications + Peripheral Vascular Disease  negative cardio ROS Normal cardiovascular exam Rhythm:regular Rate:Normal     Neuro/Psych  Headaches, PSYCHIATRIC DISORDERS negative neurological ROS  negative psych ROS   GI/Hepatic negative GI ROS, Neg liver ROS,   Endo/Other  negative endocrine ROS  Renal/GU negative Renal ROS  negative genitourinary   Musculoskeletal   Abdominal   Peds  Hematology negative hematology ROS (+)   Anesthesia Other Findings Past Medical History: No date: Asthma No date: Depression No date: Fatty liver No date: Hypertension No date: Migraine Past Surgical History: No date: CESAREAN SECTION No date: FRACTURE SURGERY     Comment:  ankle surgery x 2 No date: TUBAL LIGATION BMI    Body Mass Index:  32.11 kg/m     Reproductive/Obstetrics negative OB ROS                             Anesthesia Physical Anesthesia Plan  ASA: III  Anesthesia Plan: General   Post-op Pain Management:    Induction:   PONV Risk Score and Plan:   Airway Management Planned:   Additional Equipment:   Intra-op Plan:   Post-operative Plan:   Informed Consent: I have reviewed the patients History and Physical, chart, labs and discussed the procedure including the risks, benefits and alternatives for the proposed anesthesia with the patient or authorized representative who has indicated his/her understanding and acceptance.    Dental Advisory Given  Plan Discussed with: CRNA  Anesthesia Plan Comments:         Anesthesia Quick Evaluation

## 2017-03-15 NOTE — Transfer of Care (Signed)
Immediate Anesthesia Transfer of Care Note  Patient: Samantha Avila  Procedure(s) Performed: Procedure(s): ESOPHAGOGASTRODUODENOSCOPY (EGD) WITH PROPOFOL (N/A) COLONOSCOPY WITH PROPOFOL (N/A)  Patient Location: Endoscopy Unit  Anesthesia Type:General  Level of Consciousness: sedated and responds to stimulation  Airway & Oxygen Therapy: Patient Spontanous Breathing and Patient connected to nasal cannula oxygen  Post-op Assessment: Report given to RN and Post -op Vital signs reviewed and stable  Post vital signs: Reviewed and stable  Last Vitals:  Vitals:   03/15/17 1349 03/15/17 1503  BP: (!) 143/93 (!) 97/59  Pulse: 78 60  Resp: 20 15  Temp: 36.4 C (!) 35.9 C  SpO2: 99% 100%    Last Pain:  Vitals:   03/15/17 1503  TempSrc: Tympanic  PainSc: Asleep         Complications: No apparent anesthesia complications

## 2017-03-15 NOTE — Op Note (Signed)
[facility] Gastroenterology Patient Name: Samantha Avila Procedure Date: 03/15/2017 2:17 PM MRN: [id number] Account #: [id number] Date of Birth: 1956/06/14 Admit Type: Outpatient Age: 61 Room: [facility] ENDO ROOM 4 Gender: Female Note Status: Finalized Procedure:            Colonoscopy Indications:          Colon cancer screening in patient at increased risk:                        Family history of 1st-degree relative with colon polyps                        at age 76 years (or older) Providers:            Kyion Gautier K. [name] MD, MD Referring MD:         Kerin Perna MD, MD (Referring MD) Medicines:            Propofol per Anesthesia Complications:        No immediate complications. Procedure:            Pre-Anesthesia Assessment:                       - ASA Grade Assessment: III - A patient with severe                        systemic disease.                       - After reviewing the risks and benefits, the patient                        was deemed in satisfactory condition to undergo the                        procedure.                       After obtaining informed consent, the colonoscope was                        passed under direct vision. Throughout the procedure,                        the patient's blood pressure, pulse, and oxygen                        saturations were monitored continuously. The                        Colonoscope was introduced through the anus and                        advanced to the the cecum, identified by the                        appendiceal orifice, ileocecal valve and palpation. The                        colonoscopy was performed without difficulty. The  patient tolerated the procedure well. The quality of                        the bowel preparation was adequate. The ileocecal                        valve, appendiceal orifice, and rectum were                        photographed. Findings:      A  few small-mouthed diverticula were found in the sigmoid colon. There       was no evidence of diverticular bleeding.      A 1 mm polyp was found in the mid transverse colon. The polyp was       sessile. The polyp was removed with a jumbo cold forceps. Resection and       retrieval were complete.      The entire examined colon appeared normal.      The perianal and digital rectal examinations were normal. Pertinent       negatives include normal sphincter tone.      Non-bleeding internal hemorrhoids were found. The hemorrhoids were small       and Grade I (internal hemorrhoids that do not prolapse).      The exam was otherwise without abnormality. Impression:           - Diverticulosis in the sigmoid colon. There was no                        evidence of diverticular bleeding.                       - One 1 mm polyp in the mid transverse colon, removed                        with a jumbo cold forceps. Resected and retrieved.                       - The entire examined colon is normal.                       - Non-bleeding internal hemorrhoids.                       - The examination was otherwise normal. Recommendation:       - Discharge patient to home.                       - High fiber diet.                       - Continue present medications.                       - Await pathology results.                       - Repeat colonoscopy in 5 years for surveillance.                       - Return to GI office in 3 months.                       -  The findings and recommendations were discussed with                        the patient's family. Procedure Code(s):    --- Professional ---                       [phone], Colonoscopy, flexible; with biopsy, single or                        multiple Diagnosis Code(s):    --- Professional ---                       Z83.71, Family history of colonic polyps                       K64.0, First degree hemorrhoids                       D12.3, Benign  neoplasm of transverse colon (hepatic                        flexure or splenic flexure)                       K57.30, Diverticulosis of large intestine without                        perforation or abscess without bleeding CPT copyright 2016 American Medical Association. All rights reserved. The codes documented in this report are preliminary and upon coder review may  be revised to meet current compliance requirements. Efrain Sella MD, MD 03/15/2017 3:11:00 PM This report has been signed electronically. Number of Addenda: 0 Note Initiated On: 03/15/2017 2:17 PM Scope Withdrawal Time: 0 hours 11 minutes 19 seconds  Total Procedure Duration: 0 hours 22 minutes 43 seconds       [facility]

## 2017-03-15 NOTE — Interval H&P Note (Signed)
History and Physical Interval Note:  03/15/2017 2:04 PM  Samantha Avila  has presented today for surgery, with the diagnosis of DYSPEPSIA LLQ PAIN  The various methods of treatment have been discussed with the patient and family. After consideration of risks, benefits and other options for treatment, the patient has consented to  Procedure(s): ESOPHAGOGASTRODUODENOSCOPY (EGD) WITH PROPOFOL (N/A) COLONOSCOPY WITH PROPOFOL (N/A) as a surgical intervention .  The patient's history has been reviewed, patient examined, no change in status, stable for surgery.  I have reviewed the patient's chart and labs.  Questions were answered to the patient's satisfaction.     [place], [place]

## 2017-03-15 NOTE — Op Note (Signed)
[facility] Gastroenterology Patient Name: Samantha Avila Procedure Date: 03/15/2017 2:17 PM MRN: [id number] Account #: [id number] Date of Birth: [birth year] Admit Type: Outpatient Age: 61 Room: [facility] ENDO ROOM 4 Gender: Female Note Status: Finalized Procedure:            Upper GI endoscopy Indications:          Abdominal pain in the left upper quadrant, Melena Providers:            Benay Pike. [name] MD, MD Referring MD:         Kerin Perna MD, MD (Referring MD) Medicines:            Propofol per Anesthesia Complications:        No immediate complications. Procedure:            Pre-Anesthesia Assessment:                       - ASA Grade Assessment: III - A patient with severe                        systemic disease.                       - After reviewing the risks and benefits, the patient                        was deemed in satisfactory condition to undergo the                        procedure.                       - See the other procedure note for documentation of the                        pre-procedure assessment.                       After obtaining informed consent, the endoscope was                        passed under direct vision. Throughout the procedure,                        the patient's blood pressure, pulse, and oxygen                        saturations were monitored continuously. The                        Colonoscope was introduced through the mouth, and                        advanced to the third part of duodenum. The upper GI                        endoscopy was accomplished without difficulty. The                        patient tolerated the procedure well. Findings:      The examined esophagus was normal.      Localized mildly  erythematous mucosa without bleeding was found in the       gastric antrum. Biopsies were taken with a cold forceps for Helicobacter       pylori testing.      The examined duodenum was normal.      The  exam was otherwise without abnormality. Impression:           - Normal esophagus.                       - Erythematous mucosa in the antrum. Biopsied.                       - Normal examined duodenum.                       - The examination was otherwise normal. Recommendation:       - Patient has a contact number available for                        emergencies. The signs and symptoms of potential                        delayed complications were discussed with the patient.                        Return to normal activities tomorrow. Written discharge                        instructions were provided to the patient.                       - Proceed with colonoscopy. See colonoscopy report for                        plan/recommendations. Procedure Code(s):    --- Professional ---                       [phone], Esophagogastroduodenoscopy, flexible, transoral;                        with biopsy, single or multiple Diagnosis Code(s):    --- Professional ---                       K31.89, Other diseases of stomach and duodenum                       R10.12, Left upper quadrant pain                       K92.1, Melena (includes Hematochezia) CPT copyright 2016 American Medical Association. All rights reserved. The codes documented in this report are preliminary and upon coder review may  be revised to meet current compliance requirements. Efrain Sella MD, MD 03/15/2017 3:03:55 PM This report has been signed electronically. Number of Addenda: 0 Note Initiated On: 03/15/2017 2:17 PM Total Procedure Duration: 0 hours 2 minutes 48 seconds       [facility]

## 2017-03-15 NOTE — Anesthesia Post-op Follow-up Note (Signed)
Anesthesia QCDR form completed.        

## 2017-03-16 ENCOUNTER — Encounter: Payer: Self-pay | Admitting: Internal Medicine

## 2017-03-17 LAB — SURGICAL PATHOLOGY

## 2017-03-18 NOTE — Anesthesia Postprocedure Evaluation (Signed)
Anesthesia Post Note  Patient: Samantha Avila  Procedure(s) Performed: Procedure(s) (LRB): ESOPHAGOGASTRODUODENOSCOPY (EGD) WITH PROPOFOL (N/A) COLONOSCOPY WITH PROPOFOL (N/A)  Patient location during evaluation: PACU Anesthesia Type: General Level of consciousness: awake and alert Pain management: pain level controlled Vital Signs Assessment: post-procedure vital signs reviewed and stable Respiratory status: spontaneous breathing, nonlabored ventilation, respiratory function stable and patient connected to nasal cannula oxygen Cardiovascular status: blood pressure returned to baseline and stable Postop Assessment: no apparent nausea or vomiting Anesthetic complications: no     Last Vitals:  Vitals:   03/15/17 1523 03/15/17 1533  BP: 103/75 109/82  Pulse: (!) 56 60  Resp: 14 12  Temp:    SpO2: 100% 100%    Last Pain:  Vitals:   03/15/17 1503  TempSrc: Tympanic  PainSc: Asleep                 Molli Barrows

## 2018-11-29 ENCOUNTER — Emergency Department: Payer: BC Managed Care – PPO

## 2018-11-29 ENCOUNTER — Other Ambulatory Visit: Payer: Self-pay

## 2018-11-29 ENCOUNTER — Encounter: Payer: Self-pay | Admitting: *Deleted

## 2018-11-29 ENCOUNTER — Emergency Department
Admission: EM | Admit: 2018-11-29 | Discharge: 2018-11-29 | Disposition: A | Discharge status: Home / Self Care | Payer: BC Managed Care – PPO | Attending: Emergency Medicine | Admitting: Emergency Medicine

## 2018-11-29 DIAGNOSIS — Z79899 Other long term (current) drug therapy: Secondary | ICD-10-CM | POA: Insufficient documentation

## 2018-11-29 DIAGNOSIS — I2510 Atherosclerotic heart disease of native coronary artery without angina pectoris: Secondary | ICD-10-CM | POA: Diagnosis not present

## 2018-11-29 DIAGNOSIS — Z87891 Personal history of nicotine dependence: Secondary | ICD-10-CM | POA: Diagnosis not present

## 2018-11-29 DIAGNOSIS — K529 Noninfective gastroenteritis and colitis, unspecified: Secondary | ICD-10-CM | POA: Diagnosis not present

## 2018-11-29 DIAGNOSIS — Z7982 Long term (current) use of aspirin: Secondary | ICD-10-CM | POA: Insufficient documentation

## 2018-11-29 DIAGNOSIS — J45909 Unspecified asthma, uncomplicated: Secondary | ICD-10-CM | POA: Insufficient documentation

## 2018-11-29 DIAGNOSIS — I10 Essential (primary) hypertension: Secondary | ICD-10-CM | POA: Diagnosis not present

## 2018-11-29 DIAGNOSIS — R197 Diarrhea, unspecified: Secondary | ICD-10-CM | POA: Diagnosis present

## 2018-11-29 LAB — URINALYSIS, COMPLETE (UACMP) WITH MICROSCOPIC
Bacteria, UA: NONE SEEN
Bilirubin Urine: NEGATIVE
Glucose, UA: NEGATIVE mg/dL
Ketones, ur: NEGATIVE mg/dL
Leukocytes,Ua: NEGATIVE
Nitrite: NEGATIVE
Protein, ur: NEGATIVE mg/dL
Specific Gravity, Urine: 1.01 (ref 1.005–1.030)
pH: 5 (ref 5.0–8.0)

## 2018-11-29 LAB — CBC
HCT: 41 % (ref 36.0–46.0)
Hemoglobin: 14 g/dL (ref 12.0–15.0)
MCH: 30.2 pg (ref 26.0–34.0)
MCHC: 34.1 g/dL (ref 30.0–36.0)
MCV: 88.6 fL (ref 80.0–100.0)
Platelets: 256 10*3/uL (ref 150–400)
RBC: 4.63 MIL/uL (ref 3.87–5.11)
RDW: 13.2 % (ref 11.5–15.5)
WBC: 8.1 10*3/uL (ref 4.0–10.5)
nRBC: 0 % (ref 0.0–0.2)

## 2018-11-29 LAB — C DIFFICILE QUICK SCREEN W PCR REFLEX??
C Diff antigen: NEGATIVE
C Diff toxin: NEGATIVE

## 2018-11-29 LAB — COMPREHENSIVE METABOLIC PANEL
ALT: 24 U/L (ref 0–44)
AST: 21 U/L (ref 15–41)
Albumin: 4.3 g/dL (ref 3.5–5.0)
Alkaline Phosphatase: 89 U/L (ref 38–126)
Anion gap: 10 (ref 5–15)
BUN: 9 mg/dL (ref 8–23)
CO2: 24 mmol/L (ref 22–32)
Calcium: 9.6 mg/dL (ref 8.9–10.3)
Chloride: 107 mmol/L (ref 98–111)
Creatinine, Ser: 0.72 mg/dL (ref 0.44–1.00)
GFR calc Af Amer: >60 mL/min (ref >60)
GFR calc non Af Amer: >60 mL/min (ref >60)
Glucose, Bld: 94 mg/dL (ref 70–99)
Potassium: 3.5 mmol/L (ref 3.5–5.1)
Sodium: 141 mmol/L (ref 135–145)
Total Bilirubin: 0.8 mg/dL (ref 0.3–1.2)
Total Protein: 7.6 g/dL (ref 6.5–8.1)

## 2018-11-29 LAB — LIPASE, BLOOD: Lipase: 33 U/L (ref 11–51)

## 2018-11-29 LAB — C DIFFICILE QUICK SCREEN W PCR REFLEX: C Diff interpretation: NOT DETECTED

## 2018-11-29 MED ORDER — SODIUM CHLORIDE 0.9 % IV BOLUS
1000.0000 mL | Freq: Once | INTRAVENOUS | Status: AC
  Administered 2018-11-29: 1000 mL via INTRAVENOUS

## 2018-11-29 MED ORDER — ONDANSETRON 4 MG PO TBDP: 4.0000 mg | ORAL_TABLET | Freq: Four times a day (QID) | ORAL | 0 refills | Status: DC | PRN

## 2018-11-29 MED ORDER — IOHEXOL 300 MG/ML  SOLN
100.0000 mL | Freq: Once | INTRAMUSCULAR | Status: AC | PRN
  Administered 2018-11-29: 100 mL via INTRAVENOUS

## 2018-11-29 MED ORDER — IOHEXOL 240 MG/ML SOLN
50.0000 mL | Freq: Once | INTRAMUSCULAR | Status: AC | PRN
  Administered 2018-11-29: 50 mL via ORAL

## 2018-11-29 NOTE — ED Notes (Addendum)
Pt returned from CT °

## 2018-11-29 NOTE — Discharge Instructions (Signed)
? ?  Please return to the emergency room right away if you are to develop a fever, severe nausea, your pain becomes severe or worsens, you are unable to keep food down, begin vomiting any dark or bloody fluid, you develop any dark or bloody stools, feel dehydrated, or other new concerns or symptoms arise. ? ?

## 2018-11-29 NOTE — ED Notes (Signed)
AAOx3.  Skin warm and dry.  Ambulates with easy and steady gait.  Posture upright and relaxed.  NAD

## 2018-11-29 NOTE — ED Triage Notes (Signed)
PT arrives with complaints of diarrhea and lower abdominal pain "for the last few days." Pt reports she contacted her PCP who suggested she come to the ED for further evaluation. Pt reports 8 episode of diarrhea in the last 24 hours.

## 2018-11-29 NOTE — ED Provider Notes (Signed)
[facility] Emergency Department Provider Note   ____________________________________________   First MD Initiated Contact with Patient 11/29/18 1848     (approximate)  I have reviewed the triage vital signs and the nursing notes.   HISTORY  Chief Complaint Diarrhea and Abdominal Pain    HPI Ellsie Avila is a 63 y.o. female with a history of coronary disease, asthma and depression  For about 4 to 5 days now patient has been experiencing loose watery stools.  Last watery stools about 1:00 today.  No black or bloody stool.  No travel history.  No fevers or chills.  She called her doctor and they recommended she come to the ER to be evaluated, was seen earlier today  She started to feel better except continues to have intermittent cramping discomfort not in one focal spot.  Denies history of diverticulitis.  No cough no fever.  Achy crampy, starting to improve with Kaopectate able to eat and hold fluids down and not vomiting but eating less.  No chest pain no trouble breathing.   Past Medical History:  Diagnosis Date   Asthma    Coronary artery disease    Depression    Fatty liver    Fatty liver disease, nonalcoholic    GERD (gastroesophageal reflux disease)    Hypertension    Migraine    Migraines   Sleep apnea    uses CPAP at home   Spinal stenosis     Patient Active Problem List   Diagnosis Date Noted   Essential hypertension, benign 12/22/2016   Thoracic aortic atherosclerosis ([place]) 12/22/2016   Pain in limb 12/22/2016    Past Surgical History:  Procedure Laterality Date   CESAREAN SECTION     COLONOSCOPY WITH PROPOFOL N/A 03/15/2017   Procedure: COLONOSCOPY WITH PROPOFOL;  Surgeon: Toledo, Benay Pike, MD;  Location: [facility] ENDOSCOPY;  Service: Gastroenterology;  Laterality: N/A;   DILATION AND CURETTAGE OF UTERUS     ECTOPIC PREGNANCY SURGERY     ESOPHAGOGASTRODUODENOSCOPY (EGD) WITH PROPOFOL N/A 03/15/2017   Procedure: ESOPHAGOGASTRODUODENOSCOPY (EGD) WITH PROPOFOL;  Surgeon: Toledo, Benay Pike, MD;  Location: [facility] ENDOSCOPY;  Service: Gastroenterology;  Laterality: N/A;   FRACTURE SURGERY     ankle surgery x 2   TUBAL LIGATION      Prior to Admission medications   Medication Sig Start Date End Date Taking? Authorizing Provider  albuterol (PROVENTIL HFA;VENTOLIN HFA) 108 (90 Base) MCG/ACT inhaler Inhale into the lungs. 09/14/16 09/14/17  [provider]  aspirin EC 81 MG tablet Take by mouth.    [provider]  benazepril (LOTENSIN) 10 MG tablet Take 10 mg by mouth daily.    [provider]  butalbital-acetaminophen-caffeine (FIORICET, ESGIC) 50-325-40 MG tablet Take 1 tablet by mouth 2 (two) times daily as needed for headache.    [provider]  cyclobenzaprine (FLEXERIL) 10 MG tablet  03/27/16   [provider]  fexofenadine-pseudoephedrine (ALLEGRA-D ALLERGY & CONGESTION) 180-240 MG 24 hr tablet Take 1 tablet by mouth daily. 10/15/15   Frederich Cha, MD  fluticasone Asencion Islam) 50 MCG/ACT nasal spray  01/23/17   [provider]  HYDROcodone-acetaminophen (NORCO/VICODIN) 5-325 MG tablet Take by mouth. 10/05/16   [provider]  ipratropium (ATROVENT HFA) 17 MCG/ACT inhaler Inhale 2 puffs into the lungs every 4 (four) hours as needed for wheezing.    [provider]  ondansetron (ZOFRAN ODT) 4 MG disintegrating tablet Take 1 tablet (4 mg total) by mouth every 6 (six) hours  as needed for nausea or vomiting. 11/29/18   Delman Kitten, MD  pravastatin (PRAVACHOL) 10 MG tablet Take 10 mg by mouth daily.    [provider]  sertraline (ZOLOFT) 100 MG tablet Take 100 mg by mouth daily.    [provider]    Allergies Penicillins  No family history on file.  Social History Social History   Tobacco Use   Smoking status: Former Smoker   Smokeless tobacco: Never Used  Substance Use Topics   Alcohol use: No   Drug  use: No    Review of Systems Constitutional: No fever/chills Eyes: No visual changes. ENT: No sore throat. Cardiovascular: Denies chest pain. Respiratory: Denies shortness of breath. Gastrointestinal: See HPI Genitourinary: Negative for dysuria. Musculoskeletal: Negative for back pain. Skin: Negative for rash. Neurological: Negative for headaches, areas of focal weakness or numbness.    ____________________________________________   PHYSICAL EXAM:  VITAL SIGNS: ED Triage Vitals  Enc Vitals Group     BP 11/29/18 1631 (!) 162/102     Pulse Rate 11/29/18 1631 88     Resp 11/29/18 1631 16     Temp 11/29/18 1631 98.5 F (36.9 C)     Temp src --      SpO2 11/29/18 1631 96 %     Weight 11/29/18 1633 210 lb (95.3 kg)     Height 11/29/18 1633 5' 6.75" (1.695 m)     Head Circumference --      Peak Flow --      Pain Score 11/29/18 1633 3     Pain Loc --      Pain Edu? --      Excl. in [place]? --     Constitutional: Alert and oriented. Well appearing and in no acute distress. Eyes: Conjunctivae are normal. Head: Atraumatic. Nose: No congestion/rhinnorhea. Mouth/Throat: Mucous membranes are moist. Neck: No stridor.  Cardiovascular: Normal rate, regular rhythm. Grossly normal heart sounds.  Good peripheral circulation. Respiratory: Normal respiratory effort.  No retractions. Lungs CTAB. Gastrointestinal: Soft and slightly tender in all quadrants without rebound guarding or peritonitis in any one location.  There is no guarding or severe pain in any one area.  No distention. Musculoskeletal: No lower extremity tenderness nor edema. Neurologic:  Normal speech and language. No gross focal neurologic deficits are appreciated.  Skin:  Skin is warm, dry and intact. No rash noted. Psychiatric: Mood and affect are normal. Speech and behavior are normal.  ____________________________________________   LABS (all labs ordered are listed, but only abnormal results are displayed)  Labs  Reviewed  URINALYSIS, COMPLETE (UACMP) WITH MICROSCOPIC - Abnormal; Notable for the following components:      Result Value   Color, Urine YELLOW (*)    APPearance CLEAR (*)    Hgb urine dipstick SMALL (*)    All other components within normal limits  C DIFFICILE QUICK SCREEN W PCR REFLEX  LIPASE, BLOOD  COMPREHENSIVE METABOLIC PANEL  CBC   ____________________________________________  EKG   ____________________________________________  RADIOLOGY  Ct Abdomen Pelvis W Contrast  Result Date: 11/29/2018 CLINICAL DATA:  Diarrhea with loose stools. EXAM: CT ABDOMEN AND PELVIS WITH CONTRAST TECHNIQUE: Multidetector CT imaging of the abdomen and pelvis was performed using the standard protocol following bolus administration of intravenous contrast. CONTRAST:  137mL OMNIPAQUE IOHEXOL 300 MG/ML  SOLN COMPARISON:  None. FINDINGS: Lower chest: No acute abnormality. Hepatobiliary: No focal liver abnormality is seen. No gallstones, gallbladder wall thickening, or biliary dilatation. There is probable hepatic steatosis. Pancreas:  Unremarkable. No pancreatic ductal dilatation or surrounding inflammatory changes. Spleen: Normal in size without focal abnormality. Adrenals/Urinary Tract: Adrenal glands are unremarkable. Kidneys are normal, without renal calculi, focal lesion, or hydronephrosis. Bladder is unremarkable. Stomach/Bowel: There are scattered colonic diverticula without CT evidence of diverticulitis. The appendix is normal. There is no evidence of a small-bowel obstruction. The stomach is unremarkable. Vascular/Lymphatic: Aortic atherosclerosis. No enlarged abdominal or pelvic lymph nodes. Reproductive: Uterus and bilateral adnexa are unremarkable. Other: No abdominal wall hernia or abnormality. No abdominopelvic ascites. Musculoskeletal: No acute or significant osseous findings. IMPRESSION: 1. No acute intra-abdominal abnormality detected. No findings to explain the patient's symptoms. 2. Probable  underlying hepatic steatosis. 3. Scattered colonic diverticula without CT evidence of diverticulitis. Electronically Signed   By: Constance Holster M.D.   On: 11/29/2018 20:46     CT scan negative for acute. ____________________________________________   PROCEDURES  Procedure(s) performed: None  Procedures  Critical Care performed: No  ____________________________________________   INITIAL IMPRESSION / ASSESSMENT AND PLAN / ED COURSE  Pertinent labs & imaging results that were available during my care of the patient were reviewed by me and considered in my medical decision making (see chart for details).   Differential diagnosis includes but is not limited to, abdominal perforation, aortic dissection, cholecystitis, appendicitis, diverticulitis, colitis, esophagitis/gastritis, kidney stone, pyelonephritis, urinary tract infection, aortic aneurysm. All are considered in decision and treatment plan. Based upon the patient's presentation and risk factors, proceed with CT scan to further evaluate for cause.  Her symptoms sound possibly infectious etiology versus less likely ischemic, diverticulitis, etc.  Maymie Brunke was evaluated in Emergency Department on 11/29/2018 for the symptoms described in the history of present illness. She was evaluated in the context of the global COVID-19 pandemic, which necessitated consideration that the patient might be at risk for infection with the SARS-CoV-2 virus that causes COVID-19. Institutional protocols and algorithms that pertain to the evaluation of patients at risk for COVID-19 are in a state of rapid change based on information released by regulatory bodies including the CDC and federal and state organizations. These policies and algorithms were followed during the patient's care in the ED.  Patient does not meet testing criteria  Clinical Course as of Nov 29 2247  Tue Nov 29, 2018  2213 Able to have a very small stool sample. C Diff negative.  Feels improved. Resting comfortably, would like to eat.    [MQ]    Clinical Course User Index [MQ] Delman Kitten, MD    ----------------------------------------- 10:49 PM on 11/29/2018 -----------------------------------------  Discussed with patient and I suspect likely viral etiology of colitis.  She will follow close with her primary, careful return precautions discussed.  Return precautions and treatment recommendations and follow-up discussed with the patient who is agreeable with the plan.  ____________________________________________   FINAL CLINICAL IMPRESSION(S) / ED DIAGNOSES  Final diagnoses:  Colitis presumed infectious        Note:  This document was prepared using Dragon voice recognition software and may include unintentional dictation errors       Delman Kitten, MD 11/29/18 2249

## 2020-03-04 ENCOUNTER — Other Ambulatory Visit: Payer: Self-pay | Admitting: Gastroenterology

## 2020-03-04 DIAGNOSIS — R1013 Epigastric pain: Secondary | ICD-10-CM

## 2020-03-04 DIAGNOSIS — R140 Abdominal distension (gaseous): Secondary | ICD-10-CM

## 2020-03-04 DIAGNOSIS — R634 Abnormal weight loss: Secondary | ICD-10-CM

## 2020-03-04 DIAGNOSIS — R1010 Upper abdominal pain, unspecified: Secondary | ICD-10-CM

## 2020-03-08 ENCOUNTER — Other Ambulatory Visit: Payer: Self-pay

## 2020-03-08 ENCOUNTER — Ambulatory Visit
Admission: RE | Admit: 2020-03-08 | Discharge: 2020-03-08 | Disposition: A | Discharge status: Home / Self Care | Payer: Medicare PPO | Source: Ambulatory Visit | Attending: Gastroenterology | Admitting: Gastroenterology

## 2020-03-08 DIAGNOSIS — R1010 Upper abdominal pain, unspecified: Secondary | ICD-10-CM

## 2020-03-08 DIAGNOSIS — R140 Abdominal distension (gaseous): Secondary | ICD-10-CM | POA: Diagnosis present

## 2020-03-08 DIAGNOSIS — R634 Abnormal weight loss: Secondary | ICD-10-CM | POA: Diagnosis present

## 2020-03-08 DIAGNOSIS — R1013 Epigastric pain: Secondary | ICD-10-CM | POA: Insufficient documentation

## 2020-03-08 MED ORDER — IOHEXOL 300 MG/ML  SOLN
100.0000 mL | Freq: Once | INTRAMUSCULAR | Status: AC | PRN
  Administered 2020-03-08: 100 mL via INTRAVENOUS

## 2020-06-05 DIAGNOSIS — J4540 Moderate persistent asthma, uncomplicated: Secondary | ICD-10-CM | POA: Insufficient documentation

## 2020-06-24 ENCOUNTER — Other Ambulatory Visit: Payer: Self-pay | Admitting: Family Medicine

## 2020-06-24 DIAGNOSIS — Z1231 Encounter for screening mammogram for malignant neoplasm of breast: Secondary | ICD-10-CM

## 2020-06-28 ENCOUNTER — Other Ambulatory Visit: Payer: Self-pay | Admitting: Family Medicine

## 2020-06-28 DIAGNOSIS — Z780 Asymptomatic menopausal state: Secondary | ICD-10-CM

## 2020-07-16 ENCOUNTER — Ambulatory Visit
Admission: RE | Admit: 2020-07-16 | Discharge: 2020-07-16 | Disposition: A | Discharge status: Home / Self Care | Payer: Medicare PPO | Source: Ambulatory Visit | Attending: Family Medicine | Admitting: Family Medicine

## 2020-07-16 ENCOUNTER — Other Ambulatory Visit: Payer: Self-pay

## 2020-07-16 DIAGNOSIS — Z13820 Encounter for screening for osteoporosis: Secondary | ICD-10-CM | POA: Insufficient documentation

## 2020-07-16 DIAGNOSIS — Z1231 Encounter for screening mammogram for malignant neoplasm of breast: Secondary | ICD-10-CM | POA: Diagnosis present

## 2020-07-16 DIAGNOSIS — Z780 Asymptomatic menopausal state: Secondary | ICD-10-CM

## 2020-07-16 HISTORY — DX: Malignant (primary) neoplasm, unspecified: C80.1

## 2020-07-22 ENCOUNTER — Inpatient Hospital Stay
Admission: RE | Admit: 2020-07-22 | Discharge: 2020-07-22 | Disposition: A | Discharge status: Home / Self Care | Payer: Self-pay | Source: Ambulatory Visit | Attending: *Deleted | Admitting: *Deleted

## 2020-07-22 ENCOUNTER — Other Ambulatory Visit: Payer: Self-pay | Admitting: *Deleted

## 2020-07-22 DIAGNOSIS — Z1231 Encounter for screening mammogram for malignant neoplasm of breast: Secondary | ICD-10-CM

## 2020-07-24 ENCOUNTER — Other Ambulatory Visit: Payer: Self-pay | Admitting: Family Medicine

## 2020-07-24 DIAGNOSIS — Z780 Asymptomatic menopausal state: Secondary | ICD-10-CM

## 2021-02-26 ENCOUNTER — Ambulatory Visit (INDEPENDENT_AMBULATORY_CARE_PROVIDER_SITE_OTHER): Payer: Medicare PPO | Admitting: Internal Medicine

## 2021-02-26 ENCOUNTER — Other Ambulatory Visit: Payer: Self-pay

## 2021-02-26 VITALS — BP 158/98 | HR 67 | Resp 18 | Ht 66.0 in | Wt 205.0 lb

## 2021-02-26 DIAGNOSIS — Z9989 Dependence on other enabling machines and devices: Secondary | ICD-10-CM

## 2021-02-26 DIAGNOSIS — Z7189 Other specified counseling: Secondary | ICD-10-CM

## 2021-02-26 DIAGNOSIS — G4733 Obstructive sleep apnea (adult) (pediatric): Secondary | ICD-10-CM

## 2021-02-26 DIAGNOSIS — I10 Essential (primary) hypertension: Secondary | ICD-10-CM | POA: Diagnosis not present

## 2021-02-26 DIAGNOSIS — E669 Obesity, unspecified: Secondary | ICD-10-CM

## 2021-02-26 DIAGNOSIS — J4540 Moderate persistent asthma, uncomplicated: Secondary | ICD-10-CM

## 2021-02-26 DIAGNOSIS — F330 Major depressive disorder, recurrent, mild: Secondary | ICD-10-CM

## 2021-02-26 NOTE — Progress Notes (Signed)
[facility] [place], [place] [address]  Pulmonary Sleep Medicine   Office Visit Note  Patient Name: Samantha Avila DOB: [birth year] MRN [id number]    Chief Complaint: Obstructive Sleep Apnea visit  Brief History:  Samantha Avila is seen today for initial consult for follow up on replacement CPAP'@9cmH20'$ .The patient has a 2 year history of sleep apnea. Patient is using PAP nightly.  The patient feels rested after sleeping with PAP.  The patient reports benefiting from PAP use. Reported sleepiness is  improved and the Epworth Sleepiness Score is 8 out of 24. The patient doesn't  take naps.  Patient goes to bed at 9-10pm and wakes at 7am.The patient complains of the following: none. Does mention she had eye surgery and made it difficult to wear for a few nights, but otherwise going well.  The compliance download shows 87% compliance with an average use time of 6.9 hours. The AHI is 0.5  The patient doe snot complain of limb movements disrupting sleep.  ROS  General: (-) fever, (-) chills, (-) night sweat Nose and Sinuses: (-) nasal stuffiness or itchiness, (-) postnasal drip, (-) nosebleeds, (-) sinus trouble. Mouth and Throat: (-) sore throat, (-) hoarseness. Neck: (-) swollen glands, (-) enlarged thyroid, (-) neck pain. Respiratory: - cough, - shortness of breath, - wheezing. Neurologic: - numbness, - tingling. Psychiatric: - anxiety, - depression   Current Medication: Outpatient Encounter Medications as of 02/26/2021  Medication Sig   diazepam (VALIUM) 5 MG tablet Take by mouth.   albuterol (PROVENTIL HFA;VENTOLIN HFA) 108 (90 Base) MCG/ACT inhaler Inhale into the lungs.   benazepril (LOTENSIN) 10 MG tablet Take 10 mg by mouth daily.   butalbital-acetaminophen-caffeine (FIORICET, ESGIC) 50-325-40 MG tablet Take 1 tablet by mouth 2 (two) times daily as needed for headache.   fexofenadine-pseudoephedrine (ALLEGRA-D ALLERGY & CONGESTION) 180-240 MG 24 hr tablet Take 1  tablet by mouth daily.   fluticasone (FLONASE) 50 MCG/ACT nasal spray    ipratropium (ATROVENT HFA) 17 MCG/ACT inhaler Inhale 2 puffs into the lungs every 4 (four) hours as needed for wheezing.   ondansetron (ZOFRAN ODT) 4 MG disintegrating tablet Take 1 tablet (4 mg total) by mouth every 6 (six) hours as needed for nausea or vomiting.   pravastatin (PRAVACHOL) 10 MG tablet Take 10 mg by mouth daily.   sertraline (ZOLOFT) 100 MG tablet Take 100 mg by mouth daily.   [DISCONTINUED] aspirin EC 81 MG tablet Take by mouth.   [DISCONTINUED] cyclobenzaprine (FLEXERIL) 10 MG tablet    [DISCONTINUED] HYDROcodone-acetaminophen (NORCO/VICODIN) 5-325 MG tablet Take by mouth.   No facility-administered encounter medications on file as of 02/26/2021.    Surgical History: Past Surgical History:  Procedure Laterality Date   CESAREAN SECTION     COLONOSCOPY WITH PROPOFOL N/A 03/15/2017   Procedure: COLONOSCOPY WITH PROPOFOL;  Surgeon: Toledo, Benay Pike, MD;  Location: [facility] ENDOSCOPY;  Service: Gastroenterology;  Laterality: N/A;   DILATION AND CURETTAGE OF UTERUS     ECTOPIC PREGNANCY SURGERY     ESOPHAGOGASTRODUODENOSCOPY (EGD) WITH PROPOFOL N/A 03/15/2017   Procedure: ESOPHAGOGASTRODUODENOSCOPY (EGD) WITH PROPOFOL;  Surgeon: Toledo, Benay Pike, MD;  Location: [facility] ENDOSCOPY;  Service: Gastroenterology;  Laterality: N/A;   FRACTURE SURGERY     ankle surgery x 2   TUBAL LIGATION      Medical History: Past Medical History:  Diagnosis Date   Asthma    Cancer ([place])    basal cell ca   Coronary artery disease    Depression  Fatty liver    Fatty liver disease, nonalcoholic    GERD (gastroesophageal reflux disease)    Hypertension    Migraine    Migraines   Sleep apnea    uses CPAP at home   Spinal stenosis     Family History: Non contributory to the present illness  Social History: Social History   Socioeconomic History   Marital status: Divorced    Spouse name: Not on file   Number of  children: Not on file   Years of education: Not on file   Highest education level: Not on file  Occupational History   Not on file  Tobacco Use   Smoking status: Former   Smokeless tobacco: Never  Vaping Use   Vaping Use: Never used  Substance and Sexual Activity   Alcohol use: No   Drug use: No   Sexual activity: Not on file  Other Topics Concern   Not on file  Social History Narrative   Not on file   Social Determinants of Health   Financial Resource Strain: Not on file  Food Insecurity: Not on file  Transportation Needs: Not on file  Physical Activity: Not on file  Stress: Not on file  Social Connections: Not on file  Intimate Partner Violence: Not on file    Vital Signs: Blood pressure (!) 158/98, pulse 67, resp. rate 18, height '5\' 6"'$  (1.676 m), weight 205 lb (93 kg), SpO2 96 %.  Examination: General Appearance: The patient is well-developed, well-nourished, and in no distress. Neck Circumference: 39 cm Skin: Gross inspection of skin unremarkable. Head: normocephalic, no gross deformities. Eyes: no gross deformities noted. ENT: ears appear grossly normal Neurologic: Alert and oriented. No involuntary movements.    EPWORTH SLEEPINESS SCALE:  Scale:  (0)= no chance of dozing; (1)= slight chance of dozing; (2)= moderate chance of dozing; (3)= high chance of dozing  Chance  Situtation    Sitting and reading: 2    Watching TV: 1    Sitting Inactive in public: 0    As a passenger in car: 3      Lying down to rest: 2    Sitting and talking: 0    Sitting quielty after lunch: 0    In a car, stopped in traffic: 0   TOTAL SCORE:   8 out of 24    SLEEP STUDIES:  PSG 10/17/09 AHI 9 Spo31mn 76% CPAP Titration 11/03/09   CPAP COMPLIANCE DATA:  Date Range: 01/21/21-8-31/22  Average Daily Use: 6.9 hours  Median Use: 6.9  Compliance for > 4 Hours: 87%   AHI: 0.5 respiratory events per hour  Days Used: 26/30  Mask Leak: 63.4  95th Percentile  Pressure: 9         LABS: No results found for this or any previous visit (from the past 2160 hour(s)).  Radiology: MM Outside Films Mammo  Result Date: 07/23/2020 This examination belongs to an outside facility and is stored here for comparison purposes only.  Contact the originating outside institution for any associated report or interpretation.  MM Outside Films Mammo  Result Date: 07/23/2020 This examination belongs to an outside facility and is stored here for comparison purposes only.  Contact the originating outside institution for any associated report or interpretation.  MM Outside Films Mammo  Result Date: 07/23/2020 This examination belongs to an outside facility and is stored here for comparison purposes only.  Contact the originating outside institution for any associated report or interpretation.  MM Outside  Films Mammo  Result Date: 07/23/2020 This examination belongs to an outside facility and is stored here for comparison purposes only.  Contact the originating outside institution for any associated report or interpretation.  MM Outside Films Mammo  Result Date: 07/23/2020 This examination belongs to an outside facility and is stored here for comparison purposes only.  Contact the originating outside institution for any associated report or interpretation.  MM Outside Films Mammo  Result Date: 07/23/2020 This examination belongs to an outside facility and is stored here for comparison purposes only.  Contact the originating outside institution for any associated report or interpretation.   No results found.  No results found.    Assessment and Plan: Patient Active Problem List   Diagnosis Date Noted   Moderate persistent asthma without complication [id number]   Essential hypertension, benign 12/22/2016   Thoracic aortic atherosclerosis ([place]) 12/22/2016   Pain in limb 12/22/2016   OSA on CPAP 04/17/2016   Depression 09/15/2011      The patient does  tolerate PAP and reports benefit from PAP use. The patient was reminded how to adjust mask fit and advised to change supplies regularly. The patient was also counselled on nightly use. The compliance is very good. The AHI is 0.5.  1. OSA on CPAP Continue nightly use  2. CPAP use counseling CPAP couseling-Discussed importance of adequate CPAP use as well as proper care and cleaning techniques of machine and all supplies.  3. Essential hypertension, benign Forgot Bp med this AM and will take now, continue current medication and f/u with PCP.  4. Moderate persistent asthma without complication Use inhalers as needed and as prescribed  5. Mild episode of recurrent major depressive disorder ([place]) Continue current medication and f/u with PCP.  6. Obesity (BMI 30.0-34.9) Obesity Counseling: Had a lengthy discussion regarding patients BMI and weight issues. Patient was instructed on portion control as well as increased activity. Also discussed caloric restrictions with trying to maintain intake less than 2000 Kcal. Discussions were made in accordance with the 5As of weight management. Simple actions such as not eating late and if able to, taking a walk is suggested.    General Counseling: I have discussed the findings of the evaluation and examination with Samantha Avila.  I have also discussed any further diagnostic evaluation thatmay be needed or ordered today. Samantha Avila verbalizes understanding of the findings of todays visit. We also reviewed her medications today and discussed drug interactions and side effects including but not limited excessive drowsiness and altered mental states. We also discussed that there is always a risk not just to her but also people around her. she has been encouraged to call the office with any questions or concerns that should arise related to todays visit.  No orders of the defined types were placed in this encounter.       I have personally obtained a history,  examined the patient, evaluated laboratory and imaging results, formulated the assessment and plan and placed orders.  This patient was seen by Drema Dallas, PA-C in collaboration with Dr. Devona Konig as a part of collaborative care agreement.   Richelle Ito Saunders Glance, PhD, FAASM  Diplomate, American Board of Sleep Medicine    Allyne Gee, MD [facility] Diplomate ABMS Pulmonary and Critical Care Medicine Sleep medicine

## 2021-02-26 NOTE — Patient Instructions (Signed)

## 2021-03-02 ENCOUNTER — Encounter: Payer: Self-pay | Admitting: Emergency Medicine

## 2021-03-02 ENCOUNTER — Other Ambulatory Visit: Payer: Self-pay

## 2021-03-02 ENCOUNTER — Ambulatory Visit
Admission: EM | Admit: 2021-03-02 | Discharge: 2021-03-02 | Disposition: A | Discharge status: Home / Self Care | Payer: Medicare PPO | Attending: Family Medicine | Admitting: Family Medicine

## 2021-03-02 DIAGNOSIS — Z79899 Other long term (current) drug therapy: Secondary | ICD-10-CM | POA: Diagnosis not present

## 2021-03-02 DIAGNOSIS — Z87891 Personal history of nicotine dependence: Secondary | ICD-10-CM | POA: Insufficient documentation

## 2021-03-02 DIAGNOSIS — Z20822 Contact with and (suspected) exposure to covid-19: Secondary | ICD-10-CM | POA: Insufficient documentation

## 2021-03-02 DIAGNOSIS — R0981 Nasal congestion: Secondary | ICD-10-CM

## 2021-03-02 DIAGNOSIS — Z880 Allergy status to penicillin: Secondary | ICD-10-CM | POA: Diagnosis not present

## 2021-03-02 NOTE — ED Triage Notes (Signed)
Pt c/o nasal congestion, cough, scratchy throat. Started about 4 days ago. Denies fever. She states she feels like congestion is getting into her chest.

## 2021-03-02 NOTE — ED Provider Notes (Signed)
[facility]    CSN: [id number] Arrival date & time: 03/02/21  1022      History   Chief Complaint Chief Complaint  Patient presents with   Cough   Nasal Congestion    HPI 65 year old female presents with respiratory symptoms.  Patient reports her symptoms started 4 days ago.  She reports initial sore throat which has now resolved.  She reports nasal congestion as well.  No fever.  Patient is concerned about COVID-19.  Patient desires testing today.  No relieving factors.  Patient states that she suffers from seasonal allergies and takes Flonase daily.  She is unsure if this is playing a role.  No other complaints at this time.  Past Medical History:  Diagnosis Date   Asthma    Cancer ([place])    basal cell ca   Coronary artery disease    Depression    Fatty liver    Fatty liver disease, nonalcoholic    GERD (gastroesophageal reflux disease)    Hypertension    Migraine    Migraines   Sleep apnea    uses CPAP at home   Spinal stenosis     Patient Active Problem List   Diagnosis Date Noted   Moderate persistent asthma without complication [id number]   Essential hypertension, benign 12/22/2016   Thoracic aortic atherosclerosis ([place]) 12/22/2016   Pain in limb 12/22/2016   OSA on CPAP 04/17/2016   Depression 09/15/2011    Past Surgical History:  Procedure Laterality Date   CESAREAN SECTION     COLONOSCOPY WITH PROPOFOL N/A 03/15/2017   Procedure: COLONOSCOPY WITH PROPOFOL;  Surgeon: Toledo, Benay Pike, MD;  Location: [facility] ENDOSCOPY;  Service: Gastroenterology;  Laterality: N/A;   DILATION AND CURETTAGE OF UTERUS     ECTOPIC PREGNANCY SURGERY     ESOPHAGOGASTRODUODENOSCOPY (EGD) WITH PROPOFOL N/A 03/15/2017   Procedure: ESOPHAGOGASTRODUODENOSCOPY (EGD) WITH PROPOFOL;  Surgeon: Toledo, Benay Pike, MD;  Location: [facility] ENDOSCOPY;  Service: Gastroenterology;  Laterality: N/A;   FRACTURE SURGERY     ankle surgery x 2   TUBAL LIGATION      OB History   No  obstetric history on file.      Home Medications    Prior to Admission medications   Medication Sig Start Date End Date Taking? Authorizing Provider  albuterol (PROVENTIL HFA;VENTOLIN HFA) 108 (90 Base) MCG/ACT inhaler Inhale into the lungs. 09/14/16 03/02/21 Yes [provider]  benazepril (LOTENSIN) 10 MG tablet Take 10 mg by mouth daily.   Yes [provider]  butalbital-acetaminophen-caffeine (FIORICET, ESGIC) 50-325-40 MG tablet Take 1 tablet by mouth 2 (two) times daily as needed for headache.   Yes [provider]  diazepam (VALIUM) 5 MG tablet Take by mouth. 12/02/20  Yes [provider]  fluticasone Asencion Islam) 50 MCG/ACT nasal spray  01/23/17  Yes [provider]  pravastatin (PRAVACHOL) 10 MG tablet Take 10 mg by mouth daily.   Yes [provider]  sertraline (ZOLOFT) 100 MG tablet Take 100 mg by mouth daily.   Yes [provider]  fexofenadine-pseudoephedrine (ALLEGRA-D ALLERGY & CONGESTION) 180-240 MG 24 hr tablet Take 1 tablet by mouth daily. 10/15/15   Frederich Cha, MD  ondansetron (ZOFRAN ODT) 4 MG disintegrating tablet Take 1 tablet (4 mg total) by mouth every 6 (six) hours as needed for nausea or vomiting. 11/29/18   Delman Kitten, MD    Family History Family History  Problem Relation Age of Onset   Breast cancer Paternal 41  Social History Social History   Tobacco Use   Smoking status: Former   Smokeless tobacco: Never  Scientific laboratory technician Use: Never used  Substance Use Topics   Alcohol use: No   Drug use: No     Allergies   Penicillins   Review of Systems Review of Systems Per HPI  Physical Exam Triage Vital Signs ED Triage Vitals  Enc Vitals Group     BP 03/02/21 1037 (!) 144/76     Pulse Rate 03/02/21 1037 65     Resp 03/02/21 1037 18     Temp 03/02/21 1037 98.4 F (36.9 C)     Temp Source 03/02/21 1037 Oral     SpO2 03/02/21 1037 96 %     Weight 03/02/21 1038 205 lb 0.4 oz (93 kg)      Height 03/02/21 1038 '5\' 6"'$  (1.676 m)     Head Circumference --      Peak Flow --      Pain Score 03/02/21 1037 0     Pain Loc --      Pain Edu? --      Excl. in [place]? --    No data found.  Updated Vital Signs BP (!) 144/76 (BP Location: Left Arm)   Pulse 65   Temp 98.4 F (36.9 C) (Oral)   Resp 18   Ht '5\' 6"'$  (1.676 m)   Wt 93 kg   SpO2 96%   BMI 33.09 kg/m   Visual Acuity Right Eye Distance:   Left Eye Distance:   Bilateral Distance:    Right Eye Near:   Left Eye Near:    Bilateral Near:     Physical Exam Vitals and nursing note reviewed.  Constitutional:      General: She is not in acute distress.    Appearance: Normal appearance. She is not ill-appearing.  HENT:     Head: Normocephalic and atraumatic.  Eyes:     General:        Right eye: No discharge.        Left eye: No discharge.     Conjunctiva/sclera: Conjunctivae normal.  Cardiovascular:     Rate and Rhythm: Normal rate and regular rhythm.  Pulmonary:     Effort: Pulmonary effort is normal.     Breath sounds: Normal breath sounds. No wheezing, rhonchi or rales.  Neurological:     Mental Status: She is alert.  Psychiatric:        Mood and Affect: Mood normal.        Behavior: Behavior normal.     UC Treatments / Results  Labs (all labs ordered are listed, but only abnormal results are displayed) Labs Reviewed  SARS CORONAVIRUS 2 (TAT 6-24 HRS)    EKG   Radiology No results found.  Procedures Procedures (including critical care time)  Medications Ordered in UC Medications - No data to display  Initial Impression / Assessment and Plan / UC Course  I have reviewed the triage vital signs and the nursing notes.  Pertinent labs & imaging results that were available during my care of the patient were reviewed by me and considered in my medical decision making (see chart for details).    65 year old female presents with respiratory symptoms.  Patient's only symptom currently is  congestion.  Continue Flonase.  Advised over-the-counter antihistamine.  Awaiting COVID test results.   Final Clinical Impressions(s) / UC Diagnoses   Final diagnoses:  Nasal congestion  Discharge Instructions      Flonase and OTC antihistamine.  COVID test will be back in the am.  Take care  Dr. Lacinda Axon    ED Prescriptions   None    PDMP not reviewed this encounter.   Coral Spikes, [place] 03/02/21 1204

## 2021-03-02 NOTE — Discharge Instructions (Signed)
Flonase and OTC antihistamine.  COVID test will be back in the am.  Take care  Dr. Lacinda Axon

## 2021-03-03 LAB — SARS CORONAVIRUS 2 (TAT 6-24 HRS): SARS Coronavirus 2: NEGATIVE

## 2021-06-25 ENCOUNTER — Other Ambulatory Visit: Payer: Self-pay | Admitting: Family Medicine

## 2021-06-25 DIAGNOSIS — Z1231 Encounter for screening mammogram for malignant neoplasm of breast: Secondary | ICD-10-CM

## 2021-07-21 ENCOUNTER — Other Ambulatory Visit: Payer: Self-pay

## 2021-07-21 ENCOUNTER — Ambulatory Visit
Admission: RE | Admit: 2021-07-21 | Discharge: 2021-07-21 | Disposition: A | Discharge status: Home / Self Care | Payer: Medicare PPO | Source: Ambulatory Visit | Attending: Family Medicine | Admitting: Family Medicine

## 2021-07-21 DIAGNOSIS — Z1231 Encounter for screening mammogram for malignant neoplasm of breast: Secondary | ICD-10-CM | POA: Diagnosis present

## 2022-03-30 ENCOUNTER — Ambulatory Visit: Payer: Medicare PPO | Admitting: Certified Registered Nurse Anesthetist

## 2022-03-30 ENCOUNTER — Encounter
Admission: RE | Disposition: A | Discharge status: Home / Self Care | Payer: Self-pay | Source: Home / Self Care | Attending: Gastroenterology

## 2022-03-30 ENCOUNTER — Ambulatory Visit
Admission: RE | Admit: 2022-03-30 | Discharge: 2022-03-30 | Disposition: A | Discharge status: Home / Self Care | Payer: Medicare PPO | Attending: Gastroenterology | Admitting: Gastroenterology

## 2022-03-30 DIAGNOSIS — Z87891 Personal history of nicotine dependence: Secondary | ICD-10-CM | POA: Insufficient documentation

## 2022-03-30 DIAGNOSIS — R194 Change in bowel habit: Secondary | ICD-10-CM | POA: Insufficient documentation

## 2022-03-30 DIAGNOSIS — I10 Essential (primary) hypertension: Secondary | ICD-10-CM | POA: Insufficient documentation

## 2022-03-30 DIAGNOSIS — F32A Depression, unspecified: Secondary | ICD-10-CM | POA: Diagnosis not present

## 2022-03-30 DIAGNOSIS — G43909 Migraine, unspecified, not intractable, without status migrainosus: Secondary | ICD-10-CM | POA: Insufficient documentation

## 2022-03-30 DIAGNOSIS — I2510 Atherosclerotic heart disease of native coronary artery without angina pectoris: Secondary | ICD-10-CM | POA: Diagnosis not present

## 2022-03-30 DIAGNOSIS — Z86010 Personal history of colonic polyps: Secondary | ICD-10-CM | POA: Insufficient documentation

## 2022-03-30 DIAGNOSIS — D123 Benign neoplasm of transverse colon: Secondary | ICD-10-CM | POA: Insufficient documentation

## 2022-03-30 DIAGNOSIS — K640 First degree hemorrhoids: Secondary | ICD-10-CM | POA: Insufficient documentation

## 2022-03-30 DIAGNOSIS — D122 Benign neoplasm of ascending colon: Secondary | ICD-10-CM | POA: Diagnosis not present

## 2022-03-30 DIAGNOSIS — K219 Gastro-esophageal reflux disease without esophagitis: Secondary | ICD-10-CM | POA: Diagnosis not present

## 2022-03-30 DIAGNOSIS — J45909 Unspecified asthma, uncomplicated: Secondary | ICD-10-CM | POA: Diagnosis not present

## 2022-03-30 DIAGNOSIS — K760 Fatty (change of) liver, not elsewhere classified: Secondary | ICD-10-CM | POA: Insufficient documentation

## 2022-03-30 DIAGNOSIS — Z6832 Body mass index (BMI) 32.0-32.9, adult: Secondary | ICD-10-CM | POA: Diagnosis not present

## 2022-03-30 DIAGNOSIS — R1084 Generalized abdominal pain: Secondary | ICD-10-CM | POA: Diagnosis present

## 2022-03-30 DIAGNOSIS — D120 Benign neoplasm of cecum: Secondary | ICD-10-CM | POA: Insufficient documentation

## 2022-03-30 DIAGNOSIS — K5730 Diverticulosis of large intestine without perforation or abscess without bleeding: Secondary | ICD-10-CM | POA: Insufficient documentation

## 2022-03-30 DIAGNOSIS — G4730 Sleep apnea, unspecified: Secondary | ICD-10-CM | POA: Insufficient documentation

## 2022-03-30 HISTORY — PX: COLONOSCOPY WITH PROPOFOL: SHX5780

## 2022-03-30 SURGERY — COLONOSCOPY WITH PROPOFOL
Anesthesia: General

## 2022-03-30 MED ORDER — LIDOCAINE HCL (CARDIAC) PF 100 MG/5ML IV SOSY
PREFILLED_SYRINGE | INTRAVENOUS | Status: DC | PRN
  Administered 2022-03-30: 50 mg via INTRAVENOUS

## 2022-03-30 MED ORDER — PROPOFOL 10 MG/ML IV BOLUS
INTRAVENOUS | Status: DC | PRN
  Administered 2022-03-30: 60 mg via INTRAVENOUS

## 2022-03-30 MED ORDER — SODIUM CHLORIDE 0.9 % IV SOLN
INTRAVENOUS | Status: DC
  Administered 2022-03-30: 1000 mL via INTRAVENOUS

## 2022-03-30 MED ORDER — PROPOFOL 500 MG/50ML IV EMUL
INTRAVENOUS | Status: DC | PRN
  Administered 2022-03-30: 150 ug/kg/min via INTRAVENOUS

## 2022-03-30 NOTE — Anesthesia Preprocedure Evaluation (Signed)
Anesthesia Evaluation  Patient identified by MRN, date of birth, ID band Patient awake    Reviewed: Allergy & Precautions, NPO status , Patient's Chart, lab work & pertinent test results  Airway Mallampati: III  TM Distance: >3 FB Neck ROM: Full    Dental  (+) Teeth Intact   Pulmonary neg pulmonary ROS, asthma , sleep apnea and Continuous Positive Airway Pressure Ventilation , former smoker,    Pulmonary exam normal  + decreased breath sounds      Cardiovascular Exercise Tolerance: Good hypertension, + CAD  negative cardio ROS Normal cardiovascular exam Rhythm:Regular Rate:Normal     Neuro/Psych  Headaches, Depression negative neurological ROS  negative psych ROS   GI/Hepatic negative GI ROS, Neg liver ROS, GERD  Medicated,  Endo/Other  negative endocrine ROSMorbid obesity  Renal/GU negative Renal ROS  negative genitourinary   Musculoskeletal negative musculoskeletal ROS (+)   Abdominal (+) + obese,   Peds negative pediatric ROS (+)  Hematology negative hematology ROS (+)   Anesthesia Other Findings Past Medical History: No date: Asthma No date: Cancer ([place])     Comment:  basal cell ca No date: Coronary artery disease No date: Depression No date: Fatty liver No date: Fatty liver disease, nonalcoholic No date: GERD (gastroesophageal reflux disease) No date: Hypertension No date: Migraine     Comment:  Migraines No date: Sleep apnea     Comment:  uses CPAP at home No date: Spinal stenosis  Past Surgical History: No date: CESAREAN SECTION 03/15/2017: COLONOSCOPY WITH PROPOFOL; N/A     Comment:  Procedure: COLONOSCOPY WITH PROPOFOL;  Surgeon: Toledo,               Benay Pike, MD;  Location: [facility] ENDOSCOPY;  Service:               Gastroenterology;  Laterality: N/A; No date: DILATION AND CURETTAGE OF UTERUS No date: ECTOPIC PREGNANCY SURGERY 03/15/2017: ESOPHAGOGASTRODUODENOSCOPY (EGD) WITH PROPOFOL; N/A      Comment:  Procedure: ESOPHAGOGASTRODUODENOSCOPY (EGD) WITH               PROPOFOL;  Surgeon: Toledo, Benay Pike, MD;  Location:               [facility] END[facility];  Service: Gastroenterology;  Laterality:               N/A; No date: FRACTURE SURGERY     Comment:  ankle surgery x 2 No date: TUBAL LIGATION  BMI    Body Mass Index: 32.28 kg/m      Reproductive/Obstetrics negative OB ROS                             Anesthesia Physical Anesthesia Plan  ASA: 3  Anesthesia Plan: General   Post-op Pain Management:    Induction: Intravenous  PONV Risk Score and Plan: Propofol infusion and TIVA  Airway Management Planned: Natural Airway  Additional Equipment:   Intra-op Plan:   Post-operative Plan:   Informed Consent: I have reviewed the patients History and Physical, chart, labs and discussed the procedure including the risks, benefits and alternatives for the proposed anesthesia with the patient or authorized representative who has indicated his/her understanding and acceptance.     Dental Advisory Given  Plan Discussed with: CRNA and Surgeon  Anesthesia Plan Comments:         Anesthesia Quick Evaluation

## 2022-03-30 NOTE — H&P (Signed)
Outpatient short stay form Pre-procedure 03/30/2022  Lesly Rubenstein, MD  Primary Physician: Hortencia Pilar, MD  Reason for visit:  Change in bowel habits  History of present illness:    66 y/o lady with history of hypertension here for colonoscopy for change in bowel habits. Last colonoscopy in 2018 with small TA. No blood thinners. No family history of GI malignancies. History of exploratory laparotomy for possible endometriosis.    Current Facility-Administered Medications:    0.9 %  sodium chloride infusion, , Intravenous, Continuous, Othello Sgroi, Hilton Cork, MD, Last Rate: 20 mL/hr at 03/30/22 1030, Continued from Pre-op at 03/30/22 1030  Medications Prior to Admission  Medication Sig Dispense Refill Last Dose   benazepril (LOTENSIN) 10 MG tablet Take 10 mg by mouth daily.   03/30/2022 at 0700   butalbital-acetaminophen-caffeine (FIORICET, ESGIC) 50-325-40 MG tablet Take 1 tablet by mouth 2 (two) times daily as needed for headache.   Past Week   diazepam (VALIUM) 5 MG tablet Take by mouth.   Past Week   fexofenadine-pseudoephedrine (ALLEGRA-D ALLERGY & CONGESTION) 180-240 MG 24 hr tablet Take 1 tablet by mouth daily. 30 tablet 0 Past Week   fluticasone (FLONASE) 50 MCG/ACT nasal spray    Past Week   ondansetron (ZOFRAN ODT) 4 MG disintegrating tablet Take 1 tablet (4 mg total) by mouth every 6 (six) hours as needed for nausea or vomiting. 20 tablet 0 Past Week   pravastatin (PRAVACHOL) 10 MG tablet Take 10 mg by mouth daily.   Past Week   sertraline (ZOLOFT) 100 MG tablet Take 100 mg by mouth daily.   03/30/2022 at 0700   albuterol (PROVENTIL HFA;VENTOLIN HFA) 108 (90 Base) MCG/ACT inhaler Inhale into the lungs.        Allergies  Allergen Reactions   Penicillins Anaphylaxis     Past Medical History:  Diagnosis Date   Asthma    Cancer ([place])    basal cell ca   Coronary artery disease    Depression    Fatty liver    Fatty liver disease, nonalcoholic    GERD  (gastroesophageal reflux disease)    Hypertension    Migraine    Migraines   Sleep apnea    uses CPAP at home   Spinal stenosis     Review of systems:  Otherwise negative.    Physical Exam  Gen: Alert, oriented. Appears stated age.  HEENT: PERRLA. Lungs: No respiratory distress CV: RRR Abd: soft, benign, no masses Ext: No edema    Planned procedures: Proceed with colonoscopy. The patient understands the nature of the planned procedure, indications, risks, alternatives and potential complications including but not limited to bleeding, infection, perforation, damage to internal organs and possible oversedation/side effects from anesthesia. The patient agrees and gives consent to proceed.  Please refer to procedure notes for findings, recommendations and patient disposition/instructions.     Lesly Rubenstein, MD [facility] Gastroenterology

## 2022-03-30 NOTE — Op Note (Signed)
[facility] Patient Name: Samantha Avila Procedure Date: 03/30/2022 10:13 AM MRN: [id number] Account #: [id number] Date of Birth: [birth year] Admit Type: Outpatient Age: 66 Room: [facility] ENDO ROOM 3 Gender: Female Note Status: Supervisor Override Instrument Name: Jasper Riling [id number] Procedure:             Colonoscopy Indications:           Generalized abdominal pain, Personal history of                         colonic polyps, Change in bowel habits Providers:             Andrey Farmer MD, MD Referring MD:          Kerin Perna MD, MD (Referring MD) Medicines:             Monitored Anesthesia Care Complications:         No immediate complications. Estimated blood loss:                         Minimal. Procedure:             Pre-Anesthesia Assessment:                        - Prior to the procedure, a History and Physical was                         performed, and patient medications and allergies were                         reviewed. The patient is competent. The risks and                         benefits of the procedure and the sedation options and                         risks were discussed with the patient. All questions                         were answered and informed consent was obtained.                         Patient identification and proposed procedure were                         verified by the physician, the nurse, the                         anesthesiologist, the anesthetist and the technician                         in the endoscopy suite. Mental Status Examination:                         alert and oriented. Airway Examination: normal                         oropharyngeal airway and neck mobility. Respiratory  Examination: clear to auscultation. CV Examination:                         normal. Prophylactic Antibiotics: The patient does not                         require prophylactic antibiotics. Prior                          Anticoagulants: The patient has taken no previous                         anticoagulant or antiplatelet agents. ASA Grade                         Assessment: III - A patient with severe systemic                         disease. After reviewing the risks and benefits, the                         patient was deemed in satisfactory condition to                         undergo the procedure. The anesthesia plan was to use                         monitored anesthesia care (MAC). Immediately prior to                         administration of medications, the patient was                         re-assessed for adequacy to receive sedatives. The                         heart rate, respiratory rate, oxygen saturations,                         blood pressure, adequacy of pulmonary ventilation, and                         response to care were monitored throughout the                         procedure. The physical status of the patient was                         re-assessed after the procedure.                        After obtaining informed consent, the colonoscope was                         passed under direct vision. Throughout the procedure,                         the patient's blood pressure, pulse, and oxygen  saturations were monitored continuously. The                         Colonoscope was introduced through the anus and                         advanced to the the cecum, identified by appendiceal                         orifice and ileocecal valve. The colonoscopy was                         somewhat difficult due to a redundant colon.                         Successful completion of the procedure was aided by                         applying abdominal pressure. The patient tolerated the                         procedure well. The quality of the bowel preparation                         was adequate to identify polyps. Findings:      The  perianal and digital rectal examinations were normal.      A 1 mm polyp was found in the cecum. The polyp was sessile. The polyp       was removed with a cold snare. Resection and retrieval were complete.       Estimated blood loss was minimal.      Four sessile polyps were found in the ascending colon. The polyps were 2       to 5 mm in size. These polyps were removed with a cold snare. Resection       and retrieval were complete. Estimated blood loss was minimal.      A 4 mm polyp was found in the proximal transverse colon. The polyp was       sessile. The polyp was removed with a cold snare. Resection and       retrieval were complete. Estimated blood loss was minimal.      A few small-mouthed diverticula were found in the sigmoid colon.      Internal hemorrhoids were found during retroflexion. The hemorrhoids       were Grade I (internal hemorrhoids that do not prolapse).      The exam was otherwise without abnormality on direct and retroflexion       views. Impression:            - One 1 mm polyp in the cecum, removed with a cold                         snare. Resected and retrieved.                        - Four 2 to 5 mm polyps in the ascending colon,                         removed with a cold snare.  Resected and retrieved.                        - One 4 mm polyp in the proximal transverse colon,                         removed with a cold snare. Resected and retrieved.                        - Diverticulosis in the sigmoid colon.                        - Internal hemorrhoids.                        - The examination was otherwise normal on direct and                         retroflexion views. Recommendation:        - Discharge patient to home.                        - Resume previous diet.                        - Continue present medications.                        - Await pathology results.                        - Repeat colonoscopy in 3 years for surveillance.                         - Return to referring physician as previously                         scheduled. Procedure Code(s):     --- Professional ---                        [phone], Colonoscopy, flexible; with removal of                         tumor(s), polyp(s), or other lesion(s) by snare                         technique Diagnosis Code(s):     --- Professional ---                        K63.5, Polyp of colon                        K64.0, First degree hemorrhoids                        R10.84, Generalized abdominal pain                        R19.4, Change in bowel habit                        K57.30, Diverticulosis of  large intestine without                         perforation or abscess without bleeding CPT copyright 2019 American Medical Association. All rights reserved. The codes documented in this report are preliminary and upon coder review may  be revised to meet current compliance requirements. Andrey Farmer MD, MD 03/30/2022 11:14:00 AM Number of Addenda: 0 Note Initiated On: 03/30/2022 10:13 AM Scope Withdrawal Time: 0 hours 11 minutes 24 seconds  Total Procedure Duration: 0 hours 22 minutes 6 seconds  Estimated Blood Loss:  Estimated blood loss was minimal.      [facility]

## 2022-03-30 NOTE — Transfer of Care (Signed)
Immediate Anesthesia Transfer of Care Note  Patient: Samantha Avila  Procedure(s) Performed: COLONOSCOPY WITH PROPOFOL  Patient Location: Endoscopy Unit  Anesthesia Type:General  Level of Consciousness: drowsy  Airway & Oxygen Therapy: Patient Spontanous Breathing  Post-op Assessment: Report given to RN and Post -op Vital signs reviewed and stable  Post vital signs: Reviewed and stable  Last Vitals:  Vitals Value Taken Time  BP 89/54 03/30/22 1108  Temp 35.9 C 03/30/22 1108  Pulse 59 03/30/22 1109  Resp 14 03/30/22 1109  SpO2 99 % 03/30/22 1109  Vitals shown include unvalidated device data.  Last Pain:  Vitals:   03/30/22 1108  TempSrc: Tympanic  PainSc: Asleep         Complications: No notable events documented.

## 2022-03-30 NOTE — Anesthesia Postprocedure Evaluation (Signed)
Anesthesia Post Note  Patient: Samantha Avila  Procedure(s) Performed: COLONOSCOPY WITH PROPOFOL  Patient location during evaluation: PACU Anesthesia Type: General Level of consciousness: awake and oriented Pain management: pain level controlled Vital Signs Assessment: post-procedure vital signs reviewed and stable Respiratory status: spontaneous breathing and respiratory function stable Cardiovascular status: stable Anesthetic complications: no   No notable events documented.   Last Vitals:  Vitals:   03/30/22 1118 03/30/22 1128  BP: 103/77 118/79  Pulse: 63 (!) 58  Resp: 20 16  Temp:    SpO2: 100% 96%    Last Pain:  Vitals:   03/30/22 1128  TempSrc:   PainSc: 0-No pain                 VAN STAVEREN,[name]

## 2022-03-30 NOTE — Anesthesia Procedure Notes (Signed)
Procedure Name: MAC Date/Time: 03/30/2022 10:14 AM  Performed by: Tollie Eth, CRNAPre-anesthesia Checklist: Patient identified, Emergency Drugs available, Suction available and Patient being monitored Patient Re-evaluated:Patient Re-evaluated prior to induction Oxygen Delivery Method: Nasal cannula Induction Type: IV induction Placement Confirmation: positive ETCO2

## 2022-03-30 NOTE — Interval H&P Note (Signed)
History and Physical Interval Note:  03/30/2022 10:38 AM  Samantha Avila  has presented today for surgery, with the diagnosis of BOWEL HABIT CHANGES,HX OF SDENOMATOUS POLYP OF COLON.  The various methods of treatment have been discussed with the patient and family. After consideration of risks, benefits and other options for treatment, the patient has consented to  Procedure(s): COLONOSCOPY WITH PROPOFOL (N/A) as a surgical intervention.  The patient's history has been reviewed, patient examined, no change in status, stable for surgery.  I have reviewed the patient's chart and labs.  Questions were answered to the patient's satisfaction.     Lesly Rubenstein  Ok to proceed with colonoscopy

## 2022-03-31 ENCOUNTER — Encounter: Payer: Self-pay | Admitting: Gastroenterology

## 2022-03-31 LAB — SURGICAL PATHOLOGY

## 2022-05-05 NOTE — Progress Notes (Signed)
[facility] [place], [place] [address]  Pulmonary Sleep Medicine   Office Visit Note  Patient Name: Samantha Avila DOB: 19-Feb-1956 MRN [id number]    Chief Complaint: Obstructive Sleep Apnea visit  Brief History:  Samantha Avila is seen today for an annual follow up visit for CPAP@ 9 cmH2O. The patient has a 3 year history of sleep apnea. Patient is using PAP nightly.  The patient feels rested after sleeping with PAP.  The patient reports benefiting from PAP use. Reported sleepiness is  improved and the Epworth Sleepiness Score is 4 out of 24. The patient does not take naps. The patient complains of the following: no complaints with therapy. Patient has excessive mask leak. After discussion, patient understands to change her seals The compliance download shows 94% compliance with an average use time of 7 hours 14 minutes. The AHI is 0.7.  The patient does not complain of limb movements disrupting sleep. The patient continues to require PAP therapy in order to eliminate sleep apnea. Patient reports she has trouble initiating sleep some nights, it can take as long as 1-2 hours.  ROS  General: (-) fever, (-) chills, (-) night sweat Nose and Sinuses: (-) nasal stuffiness or itchiness, (-) postnasal drip, (-) nosebleeds, (-) sinus trouble. Mouth and Throat: (-) sore throat, (-) hoarseness. Neck: (-) swollen glands, (-) enlarged thyroid, (-) neck pain. Respiratory: - cough, - shortness of breath, - wheezing. Neurologic: - numbness, - tingling. Psychiatric: + anxiety, + depression   Current Medication: Outpatient Encounter Medications as of 05/06/2022  Medication Sig   albuterol (PROVENTIL HFA;VENTOLIN HFA) 108 (90 Base) MCG/ACT inhaler Inhale into the lungs.   benazepril (LOTENSIN) 10 MG tablet Take 10 mg by mouth daily.   butalbital-acetaminophen-caffeine (FIORICET, ESGIC) 50-325-40 MG tablet Take 1 tablet by mouth 2 (two) times daily as needed for headache.   diazepam  (VALIUM) 5 MG tablet Take by mouth.   fexofenadine-pseudoephedrine (ALLEGRA-D ALLERGY & CONGESTION) 180-240 MG 24 hr tablet Take 1 tablet by mouth daily.   fluticasone (FLONASE) 50 MCG/ACT nasal spray    ondansetron (ZOFRAN ODT) 4 MG disintegrating tablet Take 1 tablet (4 mg total) by mouth every 6 (six) hours as needed for nausea or vomiting.   pravastatin (PRAVACHOL) 10 MG tablet Take 10 mg by mouth daily.   sertraline (ZOLOFT) 100 MG tablet Take 100 mg by mouth daily.   No facility-administered encounter medications on file as of 05/06/2022.    Surgical History: Past Surgical History:  Procedure Laterality Date   CESAREAN SECTION     COLONOSCOPY WITH PROPOFOL N/A 03/15/2017   Procedure: COLONOSCOPY WITH PROPOFOL;  Surgeon: Toledo, Benay Pike, MD;  Location: [facility] ENDOSCOPY;  Service: Gastroenterology;  Laterality: N/A;   COLONOSCOPY WITH PROPOFOL N/A 03/30/2022   Procedure: COLONOSCOPY WITH PROPOFOL;  Surgeon: Lesly Rubenstein, MD;  Location: [facility] ENDOSCOPY;  Service: Endoscopy;  Laterality: N/A;   DILATION AND CURETTAGE OF UTERUS     ECTOPIC PREGNANCY SURGERY     ESOPHAGOGASTRODUODENOSCOPY (EGD) WITH PROPOFOL N/A 03/15/2017   Procedure: ESOPHAGOGASTRODUODENOSCOPY (EGD) WITH PROPOFOL;  Surgeon: Toledo, Benay Pike, MD;  Location: [facility] ENDOSCOPY;  Service: Gastroenterology;  Laterality: N/A;   FRACTURE SURGERY     ankle surgery x 2   TUBAL LIGATION      Medical History: Past Medical History:  Diagnosis Date   Asthma    Cancer ([place])    basal cell ca   Coronary artery disease    Depression    Fatty liver  Fatty liver disease, nonalcoholic    GERD (gastroesophageal reflux disease)    Hypertension    Migraine    Migraines   Sleep apnea    uses CPAP at home   Spinal stenosis     Family History: Non contributory to the present illness  Social History: Social History   Socioeconomic History   Marital status: Divorced    Spouse name: Not on file   Number of children:  Not on file   Years of education: Not on file   Highest education level: Not on file  Occupational History   Not on file  Tobacco Use   Smoking status: Former   Smokeless tobacco: Never  Vaping Use   Vaping Use: Never used  Substance and Sexual Activity   Alcohol use: No   Drug use: No   Sexual activity: Not on file  Other Topics Concern   Not on file  Social History Narrative   Not on file   Social Determinants of Health   Financial Resource Strain: Not on file  Food Insecurity: Not on file  Transportation Needs: Not on file  Physical Activity: Not on file  Stress: Not on file  Social Connections: Not on file  Intimate Partner Violence: Not on file    Vital Signs: Blood pressure (!) 148/84, pulse 60, resp. rate 12, height '5\' 7"'$  (1.702 m), weight 207 lb 1.6 oz (93.9 kg), SpO2 98 %. Body mass index is 32.44 kg/m.    Examination: General Appearance: The patient is well-developed, well-nourished, and in no distress. Neck Circumference: 39 cm Skin: Gross inspection of skin unremarkable. Head: normocephalic, no gross deformities. Eyes: no gross deformities noted. ENT: ears appear grossly normal Neurologic: Alert and oriented. No involuntary movements.  STOP BANG RISK ASSESSMENT S (snore) Have you been told that you snore?     No   T (tired) Are you often tired, fatigued, or sleepy during the day?   NO  O (obstruction) Do you stop breathing, choke, or gasp during sleep? NO   P (pressure) Do you have or are you being treated for high blood pressure? YES   B (BMI) Is your body index greater than 35 kg/m? NO   A (age) Are you 67 years old or older? YES   N (neck) Do you have a neck circumference greater than 16 inches?   NO   G (gender) Are you a female? NO   TOTAL STOP/BANG "YES" ANSWERS 2       A STOP-Bang score of 2 or less is considered low risk, and a score of 5 or more is high risk for having either moderate or severe OSA. For people who score 3 or 4,  doctors may need to perform further assessment to determine how likely they are to have OSA.         EPWORTH SLEEPINESS SCALE:  Scale:  (0)= no chance of dozing; (1)= slight chance of dozing; (2)= moderate chance of dozing; (3)= high chance of dozing  Chance  Situtation    Sitting and reading: 1    Watching TV: 0    Sitting Inactive in public: 0    As a passenger in car: 3      Lying down to rest: 0    Sitting and talking: 0    Sitting quielty after lunch: 0    In a car, stopped in traffic: 0   TOTAL SCORE:   4 out of 24    SLEEP STUDIES:  PSG (09/2009) AHI 9/hr, REM AHI 41/hr, min SpO2 76% Titration (10/2009) CPAP@ 9 cmH2O   CPAP COMPLIANCE DATA:  Date Range: 05/03/2021-05/02/2022  Average Daily Use: 7 hours 14 minutes  Median Use: 7 hours 12 minutes  Compliance for > 4 Hours: 94%  AHI: 0.7 respiratory events per hour  Days Used: 350/365 days  Mask Leak: 79.4  95th Percentile Pressure: 9         LABS: Recent Results (from the past 2160 hour(s))  Surgical pathology     Status: None   Collection Time: 03/30/22 10:55 AM  Result Value Ref Range   SURGICAL PATHOLOGY      SURGICAL PATHOLOGY CASE: [id number] PATIENT: [place] [facility] Surgical Pathology Report     Specimen Submitted: A. Colon polyp, prox tran; cold snare B. Colon polyp x5, asc(4) cecum(1); c snare  Clinical History: Bowel habit changes, HX of adenomatous polyp of colon. Colon polyps, hemorrhoids, diverticulosis      DIAGNOSIS: A.  COLON, PROXIMAL TRANSVERSE, POLYP; COLD SNARE BIOPSIES: - MULTIPLE FRAGMENTS OF TUBULAR ADENOMA. - NO EVIDENCE OF HIGH-GRADE DYSPLASIA OR MALIGNANCY.  B.  COLON, CECUM AND ASCENDING, POLYPS; BIOPSIES: - MULTIPLE (4) FRAGMENTS OF ADENOMATOUS POLYP. - MULTIPLE FRAGMENTS OF BENIGN COLONIC MUCOSA WITH MILD MUCOSAL HYPERPLASIA. - NO EVIDENCE OF HIGH-GRADE DYSPLASIA OR MALIGNANCY.   GROSS DESCRIPTION: A. Labeled: Cold snare proximal  transverse colon polyp Received: Formalin Collection time: 10:55 AM on 03/30/2022 Placed into formalin time: 10:55 AM on 03/30/2022 Tissue fragment(s): Multiple Size: Aggregate, 1.4 x 0.6 x 0.1 cm Descrip tion: Received are fragments of tan soft tissue admixed with intestinal debris.  The ratio of soft tissue to intestinal debris is 90: 10. Entirely submitted in 1 cassette.  B. Labeled: Cold snare cecal x 1/ascending x 4 colon polyps Received: Formalin Collection time: 10:55 AM on 03/30/2022 Placed into formalin time: 10:55 AM on 03/30/2022 Tissue fragment(s): Multiple Size: Aggregate, 1.5 x 0.6 x 0.3 cm Description: Received are fragments of tan soft tissue admixed with intestinal debris.  The ratio of soft tissue to intestinal debris is 85: 15.  The larger soft tissue fragment has a resection margin which is inked blue.  This fragment is serially sectioned. Entirely submitted in cassettes 1-2 with the serially sectioned fragment in cassette 1 and the remaining fragments in cassette 2.  RB 03/30/2022  Final Diagnosis performed by Theodora Blow, MD.   Electronically signed 03/31/2022 1:01:29PM The electronic signature indicates that the named Attending Pathologist has e valuated the specimen Technical component performed at [facility], [address], [place], [place] [address] Lab: [phone] Dir: Rush Farmer, MD, MMM  Professional component performed at [facility], [facility], [place], [place], [place] [address] Lab: [phone] Dir: Kathi Simpers, MD     Radiology: No results found.  No results found.  No results found.    Assessment and Plan: Patient Active Problem List   Diagnosis Date Noted   Moderate persistent asthma without complication 22/97/9892   Essential hypertension, benign 12/22/2016   Thoracic aortic atherosclerosis ([place]) 12/22/2016   Pain in limb 12/22/2016   OSA on CPAP 04/17/2016   Depression 09/15/2011      The patient  does tolerate PAP and reports benefit from PAP use. The patient was reminded how to adjust mask fit and advised to change supplies regularly. The patient was also counselled on nightly use. The compliance is excellent. The AHI is 0.7. Pt continues to require cpap to treat her osa and is medically necessary.  1. OSA on CPAP Continue excellent compliance  2. CPAP use counseling CPAP couseling-Discussed importance of adequate CPAP use as well as proper care and cleaning techniques of machine and all supplies.  3. Essential hypertension, benign Has not taken medication yet this morning. Continue current medication and f/u with PCP.  4. Moderate persistent asthma without complication Continue inhaler as prescribed  5. Mild episode of recurrent major depressive disorder ([place]) Continue current medication and f/u with PCP.  6. Obesity (BMI 30.0-34.9) Obesity Counseling: Had a lengthy discussion regarding patients BMI and weight issues. Patient was instructed on portion control as well as increased activity. Also discussed caloric restrictions with trying to maintain intake less than 2000 Kcal. Discussions were made in accordance with the 5As of weight management. Simple actions such as not eating late and if able to, taking a walk is suggested.    General Counseling: I have discussed the findings of the evaluation and examination with Natausha.  I have also discussed any further diagnostic evaluation thatmay be needed or ordered today. Berklie verbalizes understanding of the findings of todays visit. We also reviewed her medications today and discussed drug interactions and side effects including but not limited excessive drowsiness and altered mental states. We also discussed that there is always a risk not just to her but also people around her. she has been encouraged to call the office with any questions or concerns that should arise related to todays visit.  No orders of the defined types were  placed in this encounter.       I have personally obtained a history, examined the patient, evaluated laboratory and imaging results, formulated the assessment and plan and placed orders.  This patient was seen by Drema Dallas, PA-C in collaboration with Dr. Devona Konig as a part of collaborative care agreement.  Allyne Gee, MD [facility] Diplomate ABMS Pulmonary Critical Care Medicine and Sleep Medicine

## 2022-05-06 ENCOUNTER — Ambulatory Visit (INDEPENDENT_AMBULATORY_CARE_PROVIDER_SITE_OTHER): Payer: Medicare PPO | Admitting: Internal Medicine

## 2022-05-06 VITALS — BP 148/84 | HR 60 | Resp 12 | Ht 67.0 in | Wt 207.1 lb

## 2022-05-06 DIAGNOSIS — E669 Obesity, unspecified: Secondary | ICD-10-CM

## 2022-05-06 DIAGNOSIS — J4540 Moderate persistent asthma, uncomplicated: Secondary | ICD-10-CM | POA: Diagnosis not present

## 2022-05-06 DIAGNOSIS — G4733 Obstructive sleep apnea (adult) (pediatric): Secondary | ICD-10-CM | POA: Diagnosis not present

## 2022-05-06 DIAGNOSIS — Z7189 Other specified counseling: Secondary | ICD-10-CM | POA: Diagnosis not present

## 2022-05-06 DIAGNOSIS — F330 Major depressive disorder, recurrent, mild: Secondary | ICD-10-CM

## 2022-05-06 DIAGNOSIS — I10 Essential (primary) hypertension: Secondary | ICD-10-CM | POA: Diagnosis not present

## 2022-05-06 NOTE — Patient Instructions (Signed)

## 2022-07-20 ENCOUNTER — Other Ambulatory Visit: Payer: Self-pay | Admitting: Family Medicine

## 2022-07-20 DIAGNOSIS — Z1231 Encounter for screening mammogram for malignant neoplasm of breast: Secondary | ICD-10-CM

## 2022-08-10 ENCOUNTER — Ambulatory Visit
Admission: RE | Admit: 2022-08-10 | Discharge: 2022-08-10 | Disposition: A | Discharge status: Home / Self Care | Payer: Medicare PPO | Source: Ambulatory Visit | Attending: Family Medicine | Admitting: Family Medicine

## 2022-08-10 DIAGNOSIS — Z1231 Encounter for screening mammogram for malignant neoplasm of breast: Secondary | ICD-10-CM | POA: Diagnosis present

## 2022-11-02 IMAGING — MG MM DIGITAL SCREENING BILAT W/ TOMO AND CAD
8 series · 8 of 24 positions shown · non-contrast
Comparison: Previous exam(s).

CLINICAL DATA: Screening.

EXAM:
DIGITAL SCREENING BILATERAL MAMMOGRAM WITH TOMOSYNTHESIS AND CAD
TECHNIQUE: Bilateral screening digital craniocaudal and mediolateral oblique
mammograms were obtained. Bilateral screening digital breast
tomosynthesis was performed. The images were evaluated with
computer-aided detection.

[L CC synth-2D]
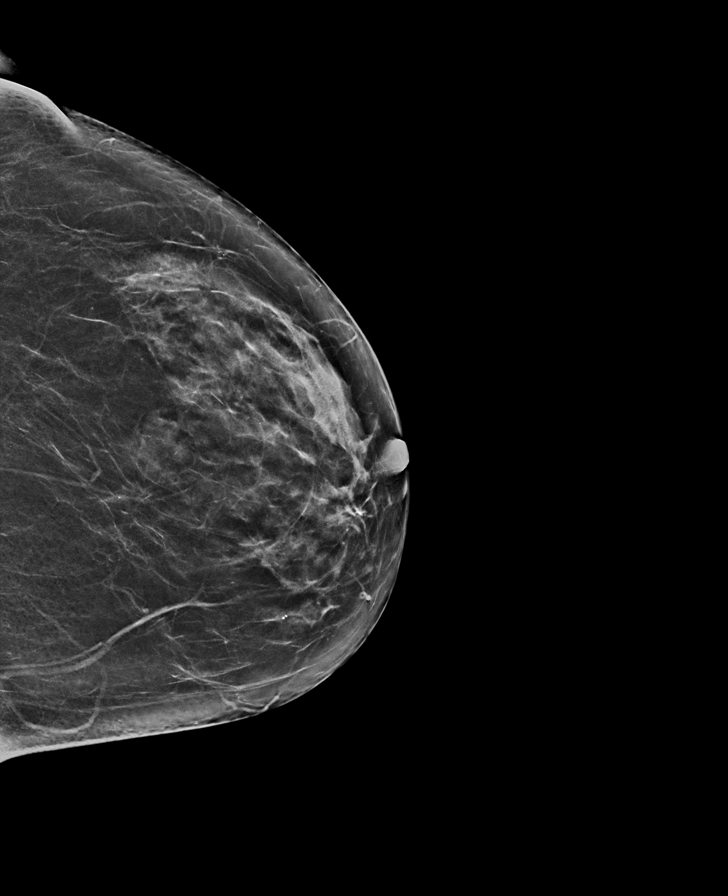

[L MLO synth-2D]
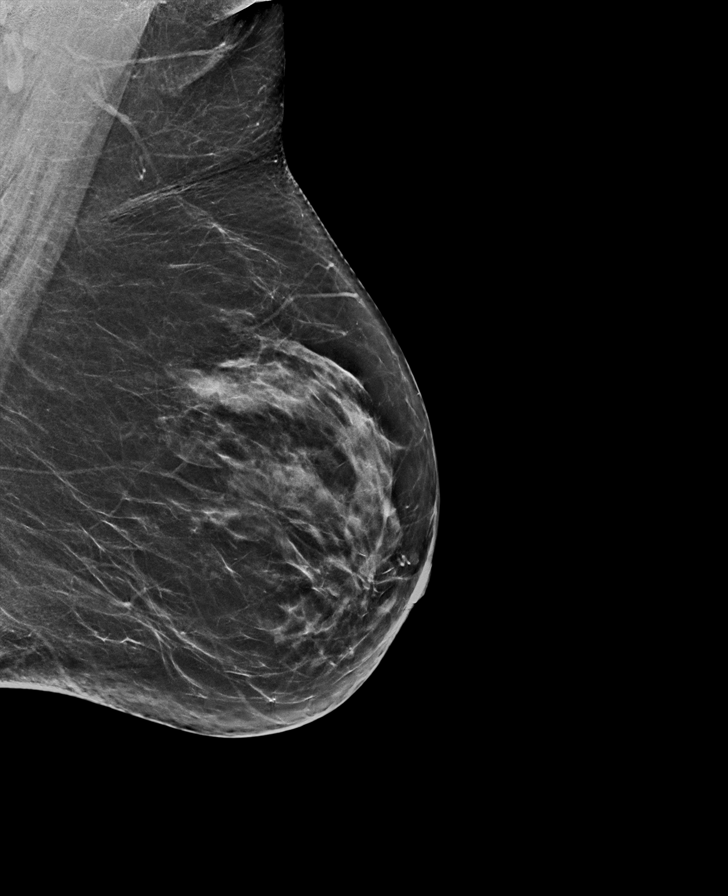

[R MLO synth-2D]
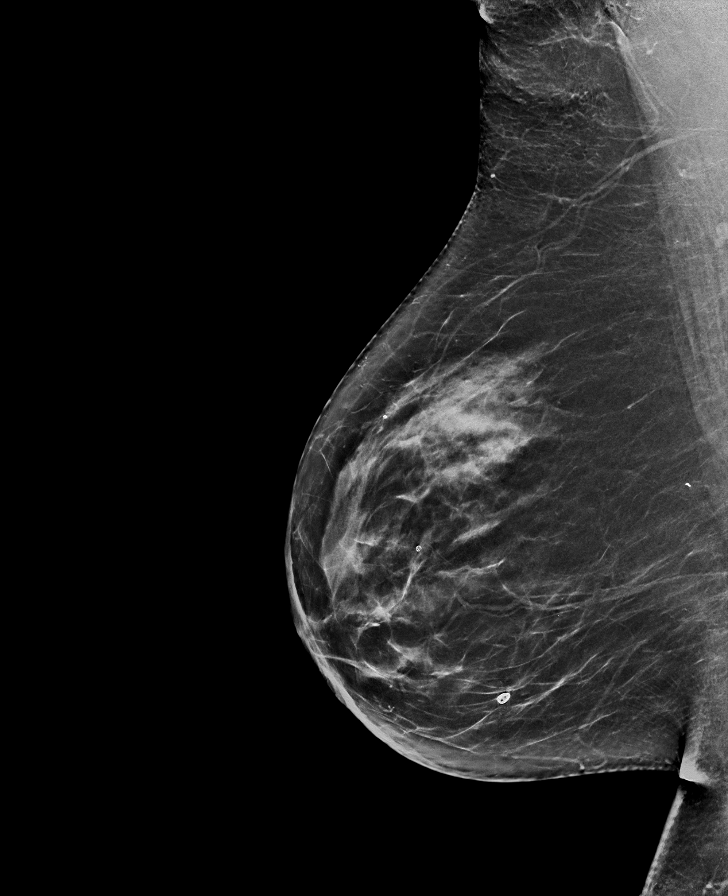

[R CC synth-2D]
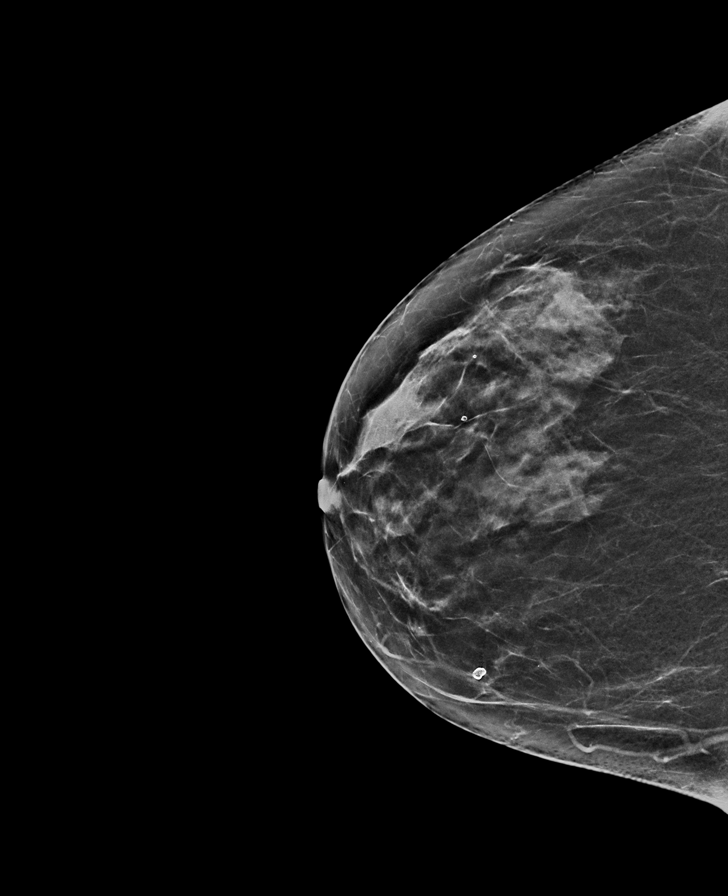

[R MLO tomo · tomo slice 37/72.0]
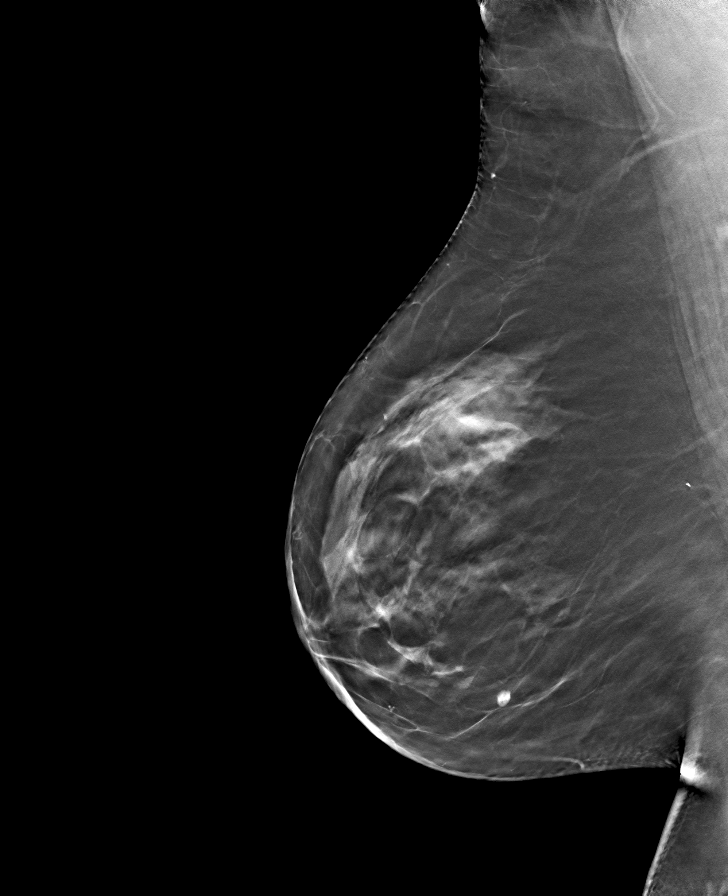

[R CC tomo · tomo slice 31/61.0]
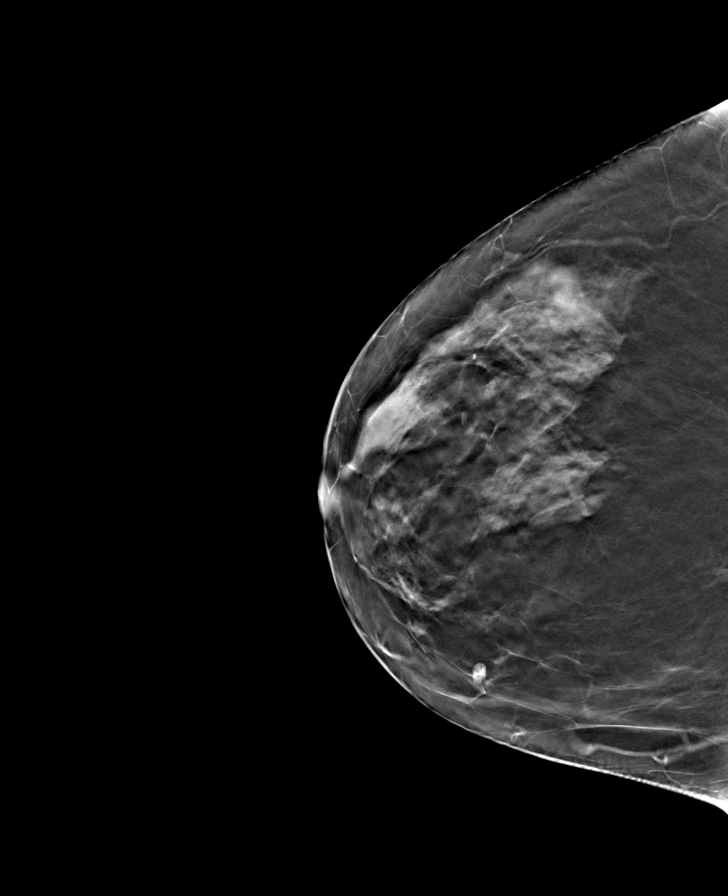

[L CC tomo · tomo slice 32/63.0]
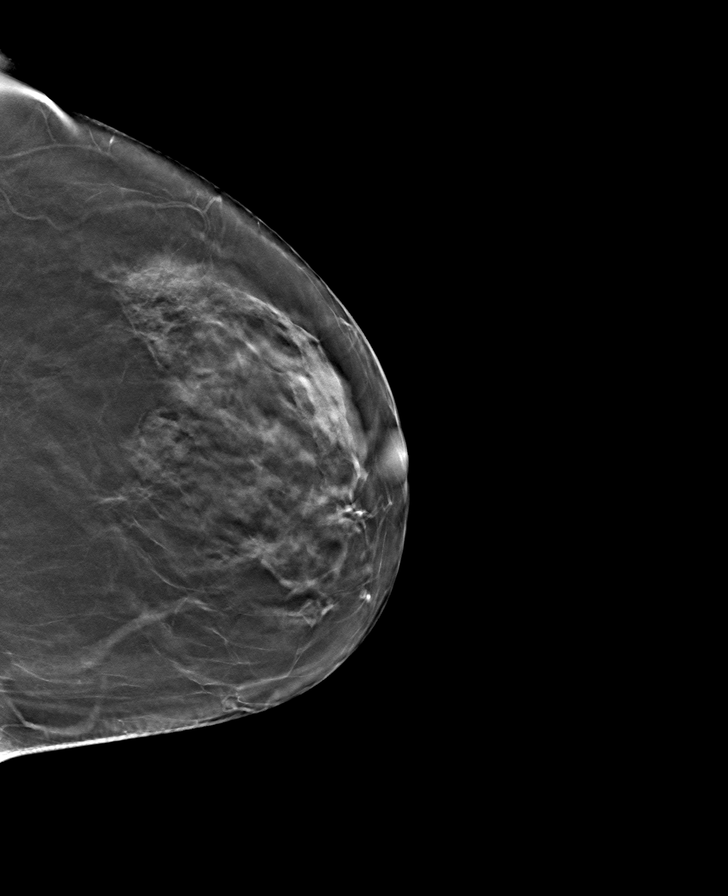

[L MLO tomo · tomo slice 35/68.0]
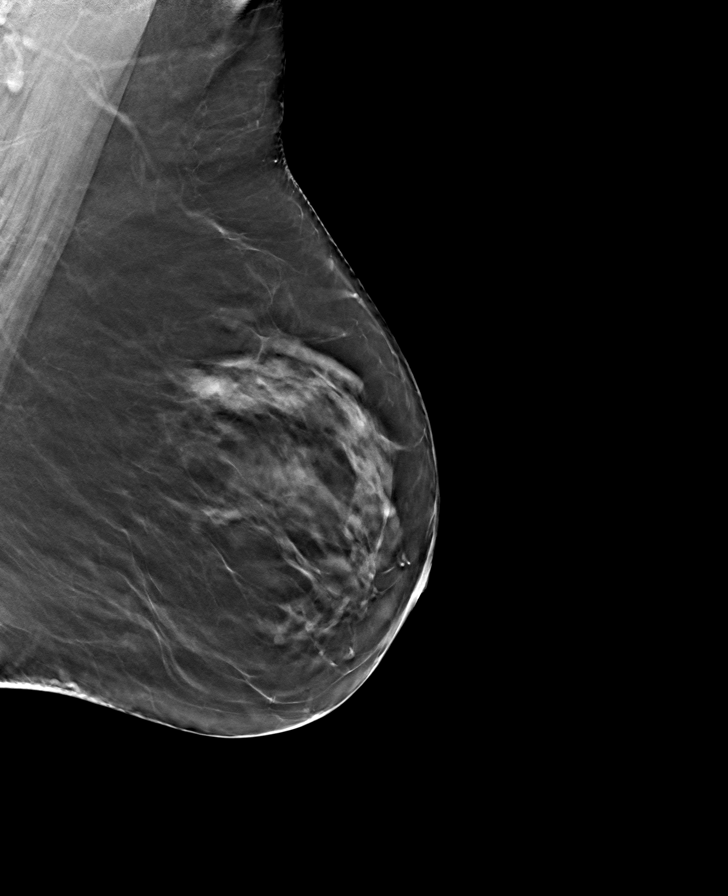

[8 of 24 positions shown; findings below may reference images not displayed]

ACR Breast Density Category b: There are scattered areas of
fibroglandular density.
FINDINGS: There are no findings suspicious for malignancy.
IMPRESSION: No mammographic evidence of malignancy. A result letter of this
screening mammogram will be mailed directly to the patient.

RECOMMENDATION:
Screening mammogram in one year. (Code:51-O-LD2)

BI-RADS CATEGORY  1: Negative.

## 2023-08-13 ENCOUNTER — Other Ambulatory Visit: Payer: Self-pay | Admitting: Family Medicine

## 2023-08-13 DIAGNOSIS — Z1231 Encounter for screening mammogram for malignant neoplasm of breast: Secondary | ICD-10-CM

## 2023-09-13 ENCOUNTER — Ambulatory Visit
Admission: RE | Admit: 2023-09-13 | Discharge: 2023-09-13 | Disposition: A | Discharge status: Home / Self Care | Source: Ambulatory Visit | Attending: Family Medicine | Admitting: Family Medicine

## 2023-09-13 DIAGNOSIS — Z1231 Encounter for screening mammogram for malignant neoplasm of breast: Secondary | ICD-10-CM | POA: Diagnosis present

## 2023-11-22 NOTE — Progress Notes (Signed)
 [facility] [address] [place], [place] [address]  Pulmonary Sleep Medicine   Office Visit Note  Patient Name: Samantha Avila DOB: October 05, 1955 MRN [id number]    Chief Complaint: Obstructive Sleep Apnea visit  Brief History:  Tess is seen today for an annual follow up visit for CPAP@ 9 cmH2O. The patient has a 14 year history of sleep apnea. Patient is not using PAP nightly.  The patient feels rested after sleeping with PAP.  The patient reports benefiting from PAP use. Reported sleepiness is improved and the Epworth Sleepiness Score is 9 out of 24. The patient does occasionally take naps. The patient complains of the following: pt had at least 2 weeks of COVID and stopped using her PAP because she was worried that unit was contaminated with COVID. Patient was not sure if it was safe to continue using her PAP. Patient is also in need of new supplies. The compliance download shows 15% compliance with an average use time of 7 hours 7 minutes. The AHI is 1.1.  The patient does not complain of limb movements disrupting sleep. Patient continues to require PAP therapy in order to eliminate sleep apnea.   ROS  General: (-) fever, (-) chills, (-) night sweat Nose and Sinuses: (-) nasal stuffiness or itchiness, (-) postnasal drip, (-) nosebleeds, (-) sinus trouble. Mouth and Throat: (-) sore throat, (-) hoarseness. Neck: (-) swollen glands, (-) enlarged thyroid, (-) neck pain. Respiratory: - cough, - shortness of breath, - wheezing. Neurologic: - numbness, - tingling. Psychiatric: + anxiety, + depression   Current Medication: Outpatient Encounter Medications as of 11/23/2023  Medication Sig   albuterol (PROVENTIL HFA;VENTOLIN HFA) 108 (90 Base) MCG/ACT inhaler Inhale into the lungs.   benazepril (LOTENSIN) 10 MG tablet Take 10 mg by mouth daily.   butalbital-acetaminophen-caffeine (FIORICET, ESGIC) 50-325-40 MG tablet Take 1 tablet by mouth 2 (two) times daily as needed for  headache.   diazepam (VALIUM) 5 MG tablet Take by mouth.   fexofenadine -pseudoephedrine (ALLEGRA-D ALLERGY & CONGESTION) 180-240 MG 24 hr tablet Take 1 tablet by mouth daily.   fluticasone  (FLONASE ) 50 MCG/ACT nasal spray    sertraline (ZOLOFT) 100 MG tablet Take 100 mg by mouth daily.   [DISCONTINUED] ondansetron  (ZOFRAN  ODT) 4 MG disintegrating tablet Take 1 tablet (4 mg total) by mouth every 6 (six) hours as needed for nausea or vomiting.   [DISCONTINUED] pravastatin (PRAVACHOL) 10 MG tablet Take 10 mg by mouth daily.   No facility-administered encounter medications on file as of 11/23/2023.    Surgical History: Past Surgical History:  Procedure Laterality Date   CESAREAN SECTION     COLONOSCOPY WITH PROPOFOL  N/A 03/15/2017   Procedure: COLONOSCOPY WITH PROPOFOL ;  Surgeon: Toledo, Alphonsus Jeans, MD;  Location: [facility] ENDOSCOPY;  Service: Gastroenterology;  Laterality: N/A;   COLONOSCOPY WITH PROPOFOL  N/A 03/30/2022   Procedure: COLONOSCOPY WITH PROPOFOL ;  Surgeon: Shane Darling, MD;  Location: [facility] ENDOSCOPY;  Service: Endoscopy;  Laterality: N/A;   DILATION AND CURETTAGE OF UTERUS     ECTOPIC PREGNANCY SURGERY     ESOPHAGOGASTRODUODENOSCOPY (EGD) WITH PROPOFOL  N/A 03/15/2017   Procedure: ESOPHAGOGASTRODUODENOSCOPY (EGD) WITH PROPOFOL ;  Surgeon: Toledo, Alphonsus Jeans, MD;  Location: [facility] ENDOSCOPY;  Service: Gastroenterology;  Laterality: N/A;   FRACTURE SURGERY     ankle surgery x 2   TUBAL LIGATION      Medical History: Past Medical History:  Diagnosis Date   Asthma    Cancer (HCC)    basal cell ca  Coronary artery disease    Depression    Fatty liver    Fatty liver disease, nonalcoholic    GERD (gastroesophageal reflux disease)    Hypertension    Migraine    Migraines   Sleep apnea    uses CPAP at home   Spinal stenosis     Family History: Non contributory to the present illness  Social History: Social History   Socioeconomic History   Marital status: Divorced     Spouse name: Not on file   Number of children: Not on file   Years of education: Not on file   Highest education level: Not on file  Occupational History   Not on file  Tobacco Use   Smoking status: Former   Smokeless tobacco: Never  Vaping Use   Vaping status: Never Used  Substance and Sexual Activity   Alcohol use: No   Drug use: No   Sexual activity: Not on file  Other Topics Concern   Not on file  Social History Narrative   Not on file   Social Drivers of Health   Financial Resource Strain: Low Risk  (08/10/2023)   Received from [facility]   Overall Financial Resource Strain (CARDIA)    Difficulty of Paying Living Expenses: Not very hard  Food Insecurity: No Food Insecurity (08/10/2023)   Received from [facility]   Hunger Vital Sign    Worried About Running Out of Food in the Last Year: Never true    Ran Out of Food in the Last Year: Never true  Transportation Needs: No Transportation Needs (08/10/2023)   Received from [facility] - Transportation    In the past 12 months, has lack of transportation kept you from medical appointments or from getting medications?: No    Lack of Transportation (Non-Medical): No  Physical Activity: Not on file  Stress: Not on file  Social Connections: Not on file  Intimate Partner Violence: Not on file    Vital Signs: Blood pressure (!) 154/98, pulse 78, resp. rate 16, height 5\' 6"  (1.676 m), weight 195 lb (88.5 kg), SpO2 95%. Body mass index is 31.47 kg/m.    Examination: General Appearance: The patient is well-developed, well-nourished, and in no distress. Neck Circumference: 39 cm Skin: Gross inspection of skin unremarkable. Head: normocephalic, no gross deformities. Eyes: no gross deformities noted. ENT: ears appear grossly normal Neurologic: Alert and oriented. No involuntary movements.  STOP BANG RISK ASSESSMENT S (snore) Have you been told that you  snore?     NO   T (tired) Are you often tired, fatigued, or sleepy during the day?   NO  O (obstruction) Do you stop breathing, choke, or gasp during sleep? NO   P (pressure) Do you have or are you being treated for high blood pressure? YES   B (BMI) Is your body index greater than 35 kg/m? NO   A (age) Are you 6 years old or older? YES   N (neck) Do you have a neck circumference greater than 16 inches?   NO   G (gender) Are you a female? NO   TOTAL STOP/BANG "YES" ANSWERS 2       A STOP-Bang score of 2 or less is considered low risk, and a score of 5 or more is high risk for having either moderate or severe OSA. For people who score 3 or 4, doctors may need to perform further assessment to determine how  likely they are to have OSA.         EPWORTH SLEEPINESS SCALE:  Scale:  (0)= no chance of dozing; (1)= slight chance of dozing; (2)= moderate chance of dozing; (3)= high chance of dozing  Chance  Situtation    Sitting and reading: 2    Watching TV: 2    Sitting Inactive in public: 1    As a passenger in car: 1      Lying down to rest: 3    Sitting and talking: 0    Sitting quielty after lunch: 0    In a car, stopped in traffic: 0   TOTAL SCORE:   9 out of 24    SLEEP STUDIES:  PSG (09/2009) AHI 9/hr, REM AHI 41/hr, min SpO2 76% Titration (10/2009) CPAP@ 9 cmH2O   CPAP COMPLIANCE DATA:  Date Range: 11/22/2022-11/21/2023  Average Daily Use: 7 hours 7 minutes  Median Use: 7 hours 11 minutes  Compliance for > 4 Hours: 15%  AHI: 1.1 respiratory events per hour  Days Used: 58/365 days  Mask Leak: 91.8  95th Percentile Pressure: 9         LABS: No results found for this or any previous visit (from the past 2160 hours).  Radiology: MM 3D SCREENING MAMMOGRAM BILATERAL BREAST Result Date: 09/14/2023 CLINICAL DATA:  Screening. EXAM: DIGITAL SCREENING BILATERAL MAMMOGRAM WITH TOMOSYNTHESIS AND CAD TECHNIQUE: Bilateral screening digital  craniocaudal and mediolateral oblique mammograms were obtained. Bilateral screening digital breast tomosynthesis was performed. The images were evaluated with computer-aided detection. COMPARISON:  Previous exam(s). ACR Breast Density Category c: The breasts are heterogeneously dense, which may obscure small masses. FINDINGS: There are no findings suspicious for malignancy. IMPRESSION: No mammographic evidence of malignancy. A result letter of this screening mammogram will be mailed directly to the patient. RECOMMENDATION: Screening mammogram in one year. (Code:SM-B-01Y) BI-RADS CATEGORY  1: Negative. Electronically Signed   By: Allena Ito M.D.   On: 09/14/2023 14:41    No results found.  No results found.    Assessment and Plan: Patient Active Problem List   Diagnosis Date Noted   Moderate persistent asthma without complication 06/05/2020   Essential hypertension, benign 12/22/2016   Thoracic aortic atherosclerosis (HCC) 12/22/2016   Pain in limb 12/22/2016   OSA on CPAP 04/17/2016   Depression 09/15/2011      The patient does tolerate PAP and reports benefit from PAP use. The patient was reminded how to adjust mask fit and advised to supplies regularly. The patient was also counselled on nightly use. The compliance is poor. The AHI is 1.1. Patient continues to require PAP to treat their apnea and is medically necessary. Will check 4 week download to check once back using PAP.   1. OSA on CPAP (Primary) Restart nightly use  2. CPAP use counseling CPAP couseling-Discussed importance of adequate CPAP use as well as proper care and cleaning techniques of machine and all supplies.  3. Essential hypertension, benign Continue current medication and f/u with PCP.  4. Mild episode of recurrent major depressive disorder (HCC) Continue current medication and f/u with PCP.  5. Obesity (BMI 30.0-34.9) Obesity Counseling: Had a lengthy discussion regarding patients BMI and weight  issues. Patient was instructed on portion control as well as increased activity. Also discussed caloric restrictions with trying to maintain intake less than 2000 Kcal. Discussions were made in accordance with the 5As of weight management. Simple actions such as not eating late and if able to, taking a  walk is suggested.    General Counseling: I have discussed the findings of the evaluation and examination with Brittnei.  I have also discussed any further diagnostic evaluation thatmay be needed or ordered today. Jenaya verbalizes understanding of the findings of todays visit. We also reviewed her medications today and discussed drug interactions and side effects including but not limited excessive drowsiness and altered mental states. We also discussed that there is always a risk not just to her but also people around her. she has been encouraged to call the office with any questions or concerns that should arise related to todays visit.  No orders of the defined types were placed in this encounter.       I have personally obtained a history, examined the patient, evaluated laboratory and imaging results, formulated the assessment and plan and placed orders.  This patient was seen by Taylor Favia, PA-C in collaboration with Dr. Cam Cava as a part of collaborative care agreement.  Cordie Deters, MD [facility] Diplomate ABMS Pulmonary Critical Care Medicine and Sleep Medicine

## 2023-11-23 ENCOUNTER — Ambulatory Visit (INDEPENDENT_AMBULATORY_CARE_PROVIDER_SITE_OTHER): Admitting: Internal Medicine

## 2023-11-23 VITALS — BP 154/98 | HR 78 | Resp 16 | Ht 66.0 in | Wt 195.0 lb

## 2023-11-23 DIAGNOSIS — Z7189 Other specified counseling: Secondary | ICD-10-CM

## 2023-11-23 DIAGNOSIS — F330 Major depressive disorder, recurrent, mild: Secondary | ICD-10-CM | POA: Diagnosis not present

## 2023-11-23 DIAGNOSIS — G4733 Obstructive sleep apnea (adult) (pediatric): Secondary | ICD-10-CM | POA: Diagnosis not present

## 2023-11-23 DIAGNOSIS — I10 Essential (primary) hypertension: Secondary | ICD-10-CM | POA: Diagnosis not present

## 2023-11-23 DIAGNOSIS — E66811 Obesity, class 1: Secondary | ICD-10-CM

## 2023-11-23 NOTE — Patient Instructions (Signed)

## 2024-08-11 ENCOUNTER — Encounter: Admission: RE | Payer: Self-pay | Source: Home / Self Care

## 2024-08-11 ENCOUNTER — Ambulatory Visit: Admission: RE | Admit: 2024-08-11 | Source: Home / Self Care
